# Patient Record
Sex: Male | Born: 1970 | Race: White | Hispanic: No | Marital: Married | Smoking: Current every day smoker
Health system: Southern US, Community
[De-identification: ages and names within clinical notes are randomized; demographics above are authoritative.]

## PROBLEM LIST (undated history)

## (undated) HISTORY — PX: KNEE SURGERY: SHX244

---

## 1997-06-11 ENCOUNTER — Emergency Department (HOSPITAL_COMMUNITY): Admission: EM | Admit: 1997-06-11 | Discharge: 1997-06-11 | Payer: Self-pay | Admitting: Emergency Medicine

## 2001-09-10 ENCOUNTER — Emergency Department (HOSPITAL_COMMUNITY): Admission: EM | Admit: 2001-09-10 | Discharge: 2001-09-10 | Payer: Self-pay | Admitting: Emergency Medicine

## 2005-02-23 ENCOUNTER — Emergency Department (HOSPITAL_COMMUNITY): Admission: EM | Admit: 2005-02-23 | Discharge: 2005-02-23 | Payer: Self-pay | Admitting: Emergency Medicine

## 2006-12-04 ENCOUNTER — Emergency Department (HOSPITAL_COMMUNITY): Admission: EM | Admit: 2006-12-04 | Discharge: 2006-12-04 | Payer: Self-pay | Admitting: Emergency Medicine

## 2008-06-04 IMAGING — CT CT HEAD W/O CM
1 series · 16 of 30 positions shown, 20 images · IV contrast (agent unspecified)
Comparison: None.

CLINICAL DATA: Left eye blurriness, dizziness.
 HEAD CT WITHOUT CONTRAST:
TECHNIQUE: Contiguous axial images were obtained from the base of the skull through the vertex according to standard protocol without contrast.

[Series 2: head_seq 4.5 h37s st · axial · 0.43mm/px · z∈[-153,-9]mm · 16 of 36 slices shown, 20 images]
[im 2/36  brain]
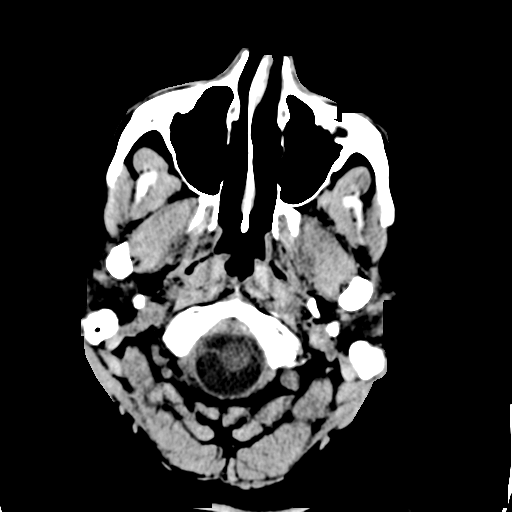
[im 2/36  bone]
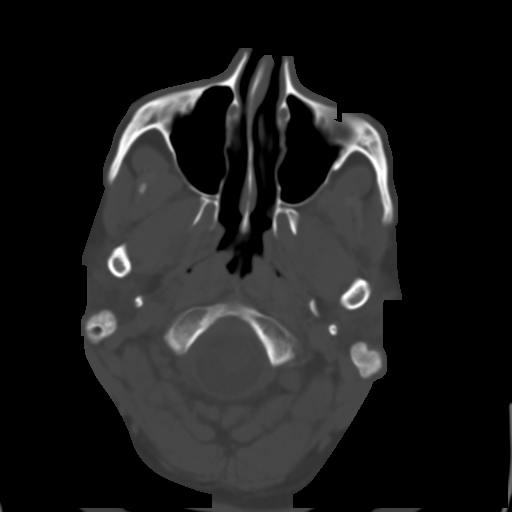
[im 4/36  brain]
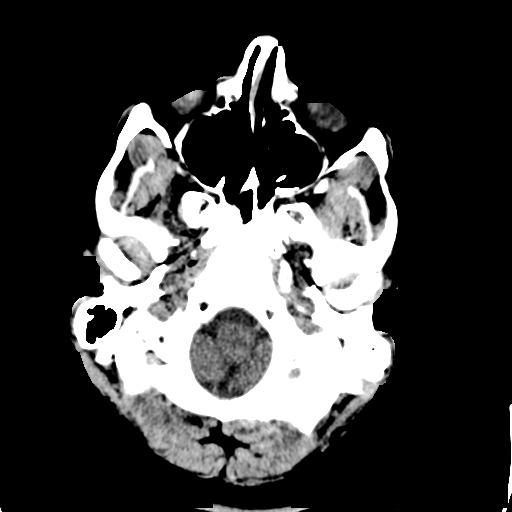
[im 7/36  brain]
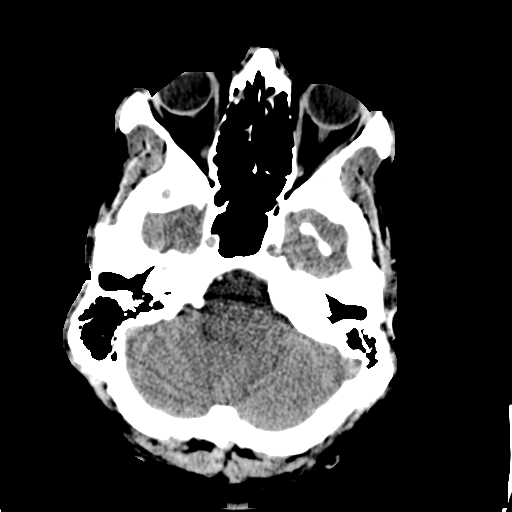
[im 9/36  brain]
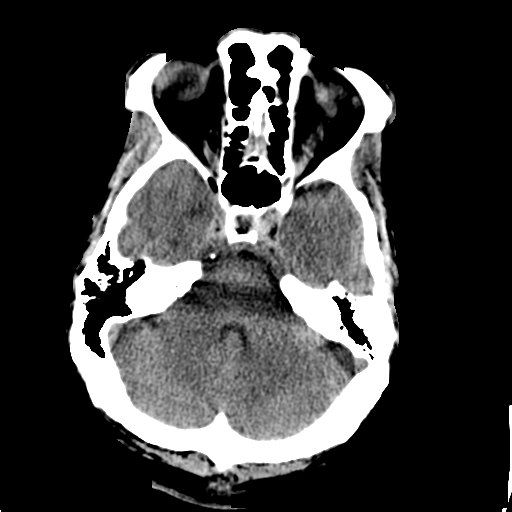
[im 10/36  brain]
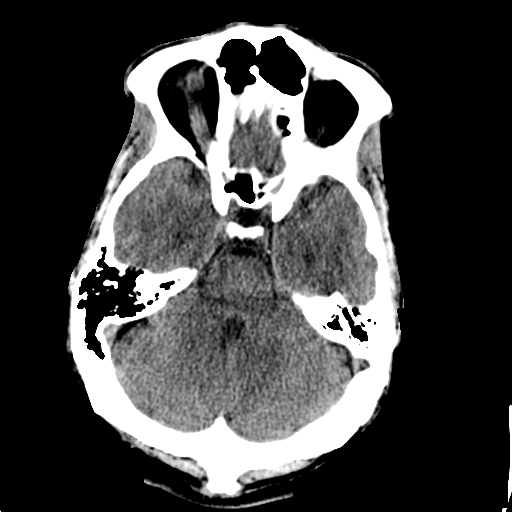
[im 10/36  bone]
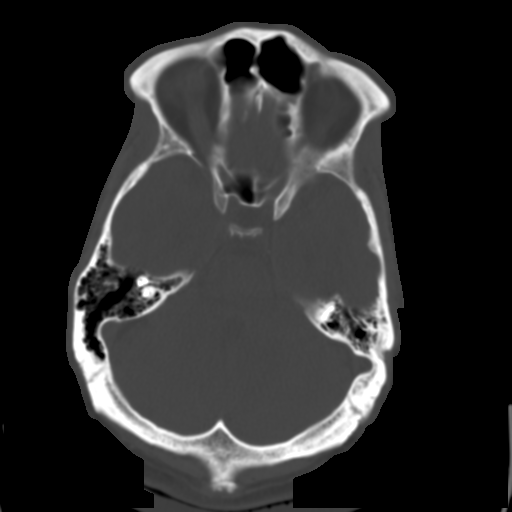
[im 13/36  brain]
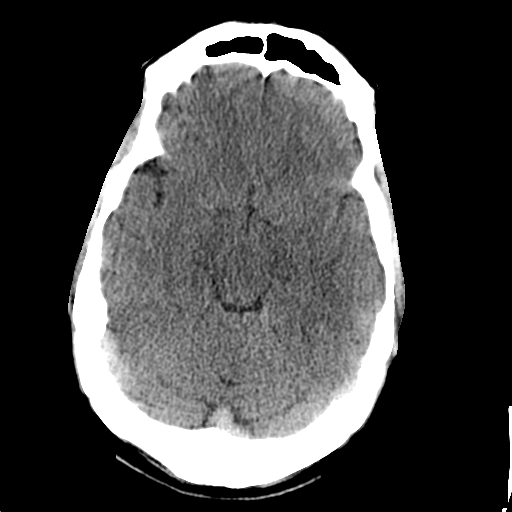
[im 15/36  brain]
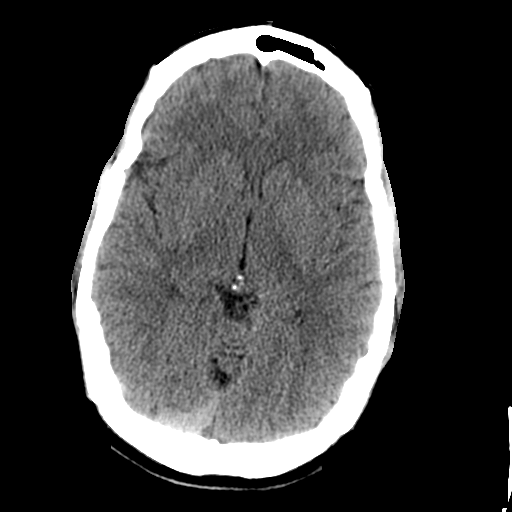
[im 17/36  brain]
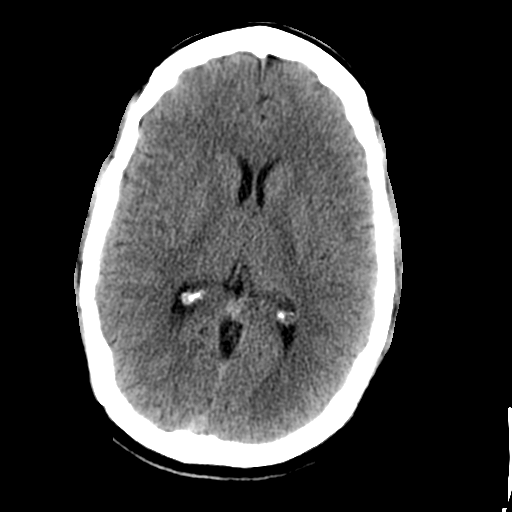
[im 19/36  brain]
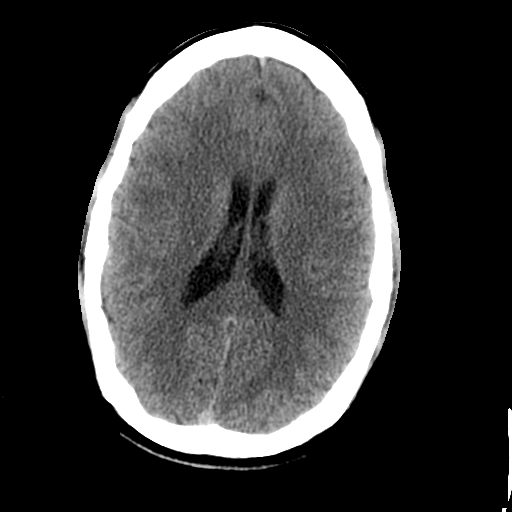
[im 19/36  bone]
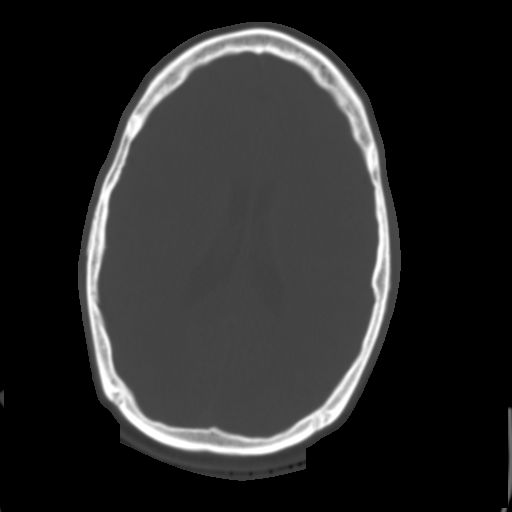
[im 21/36  brain]
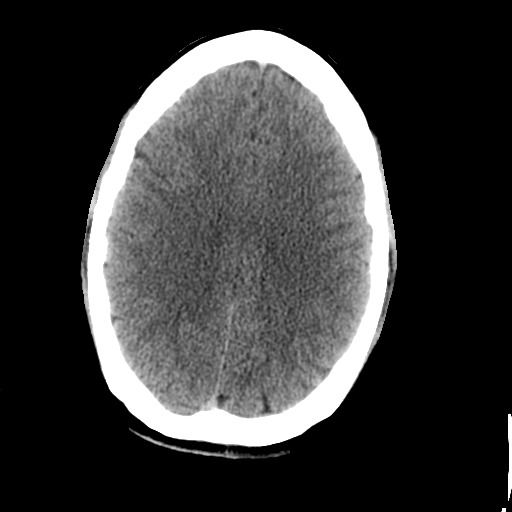
[im 23/36  brain]
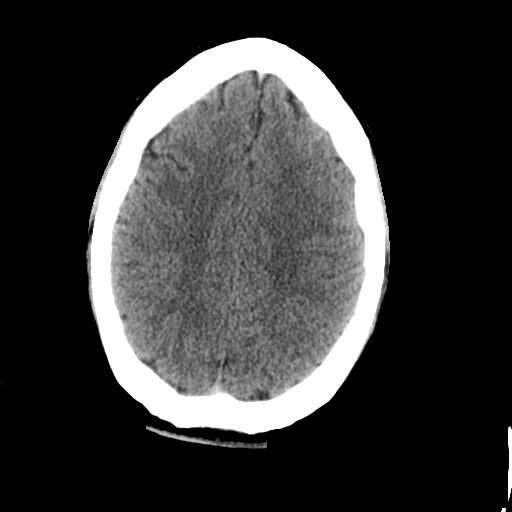
[im 26/36  brain]
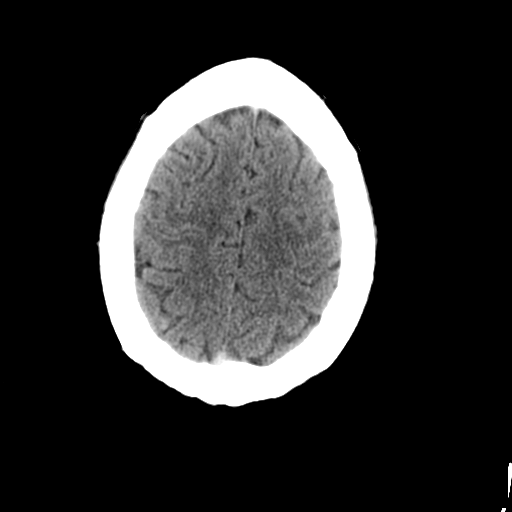
[im 27/36  brain]
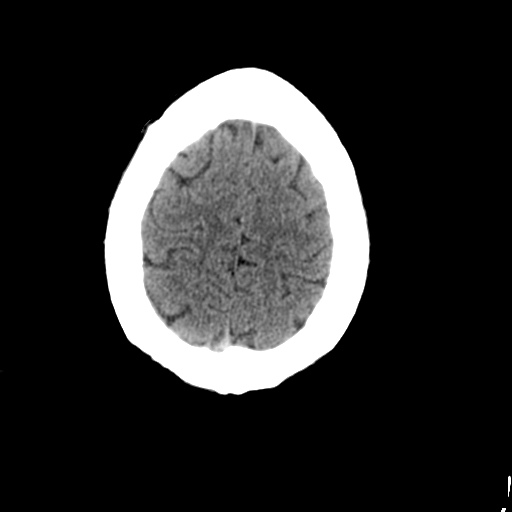
[im 27/36  bone]
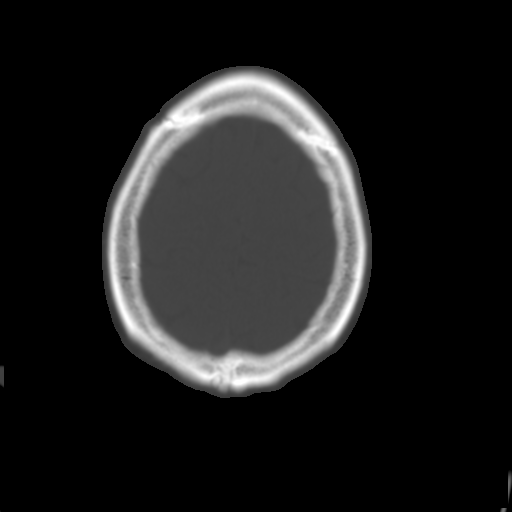
[im 29/36  brain]
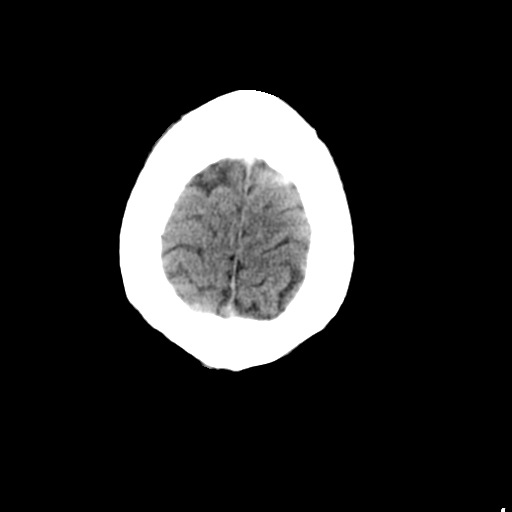
[im 32/36  brain]
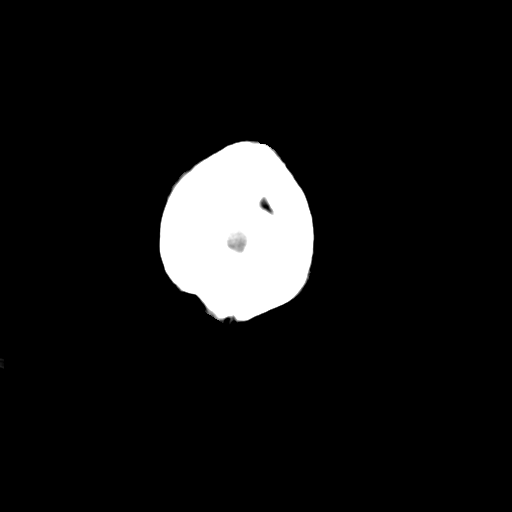
[im 34/36  brain]
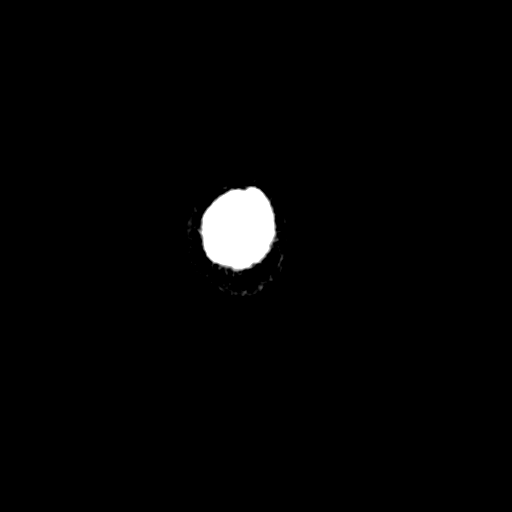

[16 of 30 positions shown; findings below may reference images not displayed]

FINDINGS: There is no evidence of intracranial hemorrhage, brain edema, acute infarct, mass lesion, or mass effect.  No other intraaxial abnormalities are seen, and the ventricles are within normal limits.  No abnormal extraaxial fluid collections or masses are identified.  No skull abnormalities are noted.
IMPRESSION: Negative non-contrast head CT.

## 2010-12-02 LAB — DIFFERENTIAL
Basophils Relative: 0
Eosinophils Absolute: 0.1
Eosinophils Relative: 2
Lymphocytes Relative: 29
Lymphs Abs: 1.8
Neutro Abs: 4

## 2010-12-02 LAB — CBC
Hemoglobin: 15.5
MCHC: 36.1 — ABNORMAL HIGH
MCV: 92.5
RBC: 4.65

## 2010-12-02 LAB — BASIC METABOLIC PANEL
CO2: 31
GFR calc non Af Amer: >60

## 2010-12-02 LAB — PROTIME-INR: INR: 1

## 2010-12-02 LAB — APTT: aPTT: 32

## 2020-08-14 DIAGNOSIS — M25521 Pain in right elbow: Secondary | ICD-10-CM | POA: Diagnosis not present

## 2020-08-14 DIAGNOSIS — M7021 Olecranon bursitis, right elbow: Secondary | ICD-10-CM | POA: Diagnosis not present

## 2021-01-18 DIAGNOSIS — I10 Essential (primary) hypertension: Secondary | ICD-10-CM | POA: Diagnosis not present

## 2021-01-18 DIAGNOSIS — J441 Chronic obstructive pulmonary disease with (acute) exacerbation: Secondary | ICD-10-CM | POA: Diagnosis not present

## 2021-01-18 DIAGNOSIS — L03012 Cellulitis of left finger: Secondary | ICD-10-CM | POA: Diagnosis not present

## 2021-01-18 DIAGNOSIS — J449 Chronic obstructive pulmonary disease, unspecified: Secondary | ICD-10-CM | POA: Diagnosis not present

## 2021-01-18 DIAGNOSIS — F17200 Nicotine dependence, unspecified, uncomplicated: Secondary | ICD-10-CM | POA: Diagnosis not present

## 2021-06-08 DIAGNOSIS — Z1211 Encounter for screening for malignant neoplasm of colon: Secondary | ICD-10-CM | POA: Diagnosis not present

## 2021-06-08 DIAGNOSIS — Z0000 Encounter for general adult medical examination without abnormal findings: Secondary | ICD-10-CM | POA: Diagnosis not present

## 2021-06-08 DIAGNOSIS — Z0100 Encounter for examination of eyes and vision without abnormal findings: Secondary | ICD-10-CM | POA: Diagnosis not present

## 2022-02-06 DIAGNOSIS — I10 Essential (primary) hypertension: Secondary | ICD-10-CM | POA: Diagnosis not present

## 2022-02-19 DIAGNOSIS — J0100 Acute maxillary sinusitis, unspecified: Secondary | ICD-10-CM | POA: Diagnosis not present

## 2022-03-14 ENCOUNTER — Ambulatory Visit (HOSPITAL_BASED_OUTPATIENT_CLINIC_OR_DEPARTMENT_OTHER): Payer: Self-pay | Admitting: Family Medicine

## 2022-03-22 DIAGNOSIS — I10 Essential (primary) hypertension: Secondary | ICD-10-CM | POA: Diagnosis not present

## 2022-04-27 DIAGNOSIS — I10 Essential (primary) hypertension: Secondary | ICD-10-CM | POA: Diagnosis not present

## 2022-05-01 NOTE — Progress Notes (Signed)
CC: Patient here to establish care and wishes to discuss the following:   COPD: Patient reports never being formally diagnosed with COPD but has "always been treated" as if he has it. Reports having COVID-19 about 3 weeks ago and has been suffering from a "COVID hangover" the past couple of weeks. He reports feeling more fatigued and "running out of energy." He reports that shortness of breath is common for him, but feels more short of breath than his normal. Reports using albuterol 3x daily. He was prescribed Advair but has been out of the medication. He has never seen a pulmonologist and does not want a referral today.   Alcohol Abuse: cold [place] quit on Friday night/Saturday morning. His last drink was on Saturday 04/30/22 at [place]. He reports drinking about 8 shots per day for the past 7-8 years and "probably more" on the weekend. Pt denies chest pain, tremors, auditory or visual hallucinations, history of withdrawal symptoms, and being hospitalized for seizures r/t withdrawal. He states he feel as if he is experiencing "a permanent hangover."   Hypertension: Reports seeing his last PCP in 2023 and has been seeing a [facility] provider to get his anti-HTN meds. Reports missing about 5-6 days of his daily medication. Does not check his BP at home. Denies chest pain, an increase in shortness of breath, vision changes, headaches, lightheadedness, double or blurred vision. He reports some minor lower extremity edema when he wears "church socks." Reports that every time he goes to the doctor, his BP is "borderline."    Last PCP visit was possibly in May/June 2023  [facility] provider, [facility] (Dr. Ardelle Balls)- plan to obtain records  Provider relocated, now establishing care here.   Testosterone Replacement: He would like to restart testosterone replacement therapy, as he been feeling  "awful." He was taking testosterone every 2 weeks for about one year (managed by PCP, Dr. Crist Fat) but did not feel comfortable having him manage this. At one point, saw [facility] urology. He would like to be referred to a urologist for this medication management.   Review of Systems  Constitutional:  Negative for chills and fever.  Eyes:  Negative for blurred vision and double vision.  Respiratory:  Positive for cough, shortness of breath ("baseline" for him) and wheezing.   Cardiovascular:  Positive for leg swelling. Negative for chest pain and palpitations.  Gastrointestinal:  Positive for nausea. Negative for vomiting.  Musculoskeletal:  Negative for myalgias.  Neurological:  Positive for headaches. Negative for dizziness, tingling, tremors, sensory change and speech change.  Psychiatric/Behavioral:  Negative for depression, hallucinations and suicidal ideas. The patient is nervous/anxious.      Brief History, Exam, Impression, and Recommendations:    BP (!) 150/100 Comment: Repeat BP  Pulse 90   Ht '6\' 4"'$  (1.93 m)   Wt 228 lb 3.2 oz (103.5 kg)   SpO2 98%   BMI 27.78 kg/m   Physical Exam Constitutional:      Appearance: Normal appearance.  Cardiovascular:     Rate and Rhythm: Normal rate and regular rhythm.  Pulmonary:     Breath sounds: Wheezing (expiratory) present.  Neurological:     Mental Status: He is alert.  Psychiatric:        Mood and Affect: Mood normal.        Behavior: Behavior normal.        Thought Content: Thought content normal.        Judgment: Judgment normal.     Problem  List Items Addressed This Visit       Cardiovascular and Mediastinum   HTN (hypertension) - Primary    Patient's main concern today is getting his amlodipine-valsartan 10-'160mg'$  medication refill. He is currently seeing a [facility] to obtain his refills since seeing his last PCP. Reports that he has missed a few days taking this daily medication and has not checked it at home. Advised pt to  get home BP monitor to assess BP, where to obtain accurate cuff ([email or web address]), and help with medication management. Educated pt on how to take accurate blood pressure and taking recordings at home. Advised pt to immediately seek emergency care if he develops an acute headache, chest pain, shortness of breath, double/blurred vision. He verbalized understanding.       Relevant Medications   amLODipine-valsartan (EXFORGE) 10-160 MG tablet     Respiratory   Chronic obstructive pulmonary disease (COPD) ([place])    Reports having to take his albuterol rescuer inhaler TID, since he does not have any refills of Advair. Reports an increase in his "normal" shortness of breath, especially with exertion. Exam with slight expiratory wheezing in lower lobes. Will refill Advair today. Does not want referral to pulmonology at this time.      Relevant Medications   fluticasone-salmeterol (ADVAIR DISKUS) 100-50 MCG/ACT AEPB     Other   Alcohol abuse    Patient reports having an "epiphany" and deciding to quit drinking "cold [place]" on early Saturday morning. Patient reports feeling like he has "a permanent hangover" but denies headache, diaphoresis, auditory and visual hallucinations, tremors, palpitations, tachycardia, change in vision, chest pain and shortness of breath. Reports occasional nausea and insomnia. Performed CIWA for alcohol scale in adults-on Up[employer]Date- with a score of 4 (very mild withdrawal). Educated pt about withdrawal symptoms often occurring up to 96 hours from last drink. Advised pt if he experiences any of the above stated symptoms to seek emergency care.       RESOLVED: Tinnitus    Patient reports left-sided tinnitus and would like referral to ENT for hearing evaluation.       Other Visit Diagnoses     Long-term current use of testosterone replacement therapy       Relevant Orders   Ambulatory referral to Urology       Return in 2 weeks (on 05/16/2022) for BP check/HTN  mgmt.  __________________________________________ Les Pou, FNP-C Primary [facility] and [place]

## 2022-05-02 ENCOUNTER — Ambulatory Visit (INDEPENDENT_AMBULATORY_CARE_PROVIDER_SITE_OTHER): Payer: BC Managed Care – PPO | Admitting: Family Medicine

## 2022-05-02 ENCOUNTER — Other Ambulatory Visit (HOSPITAL_BASED_OUTPATIENT_CLINIC_OR_DEPARTMENT_OTHER): Payer: Self-pay

## 2022-05-02 ENCOUNTER — Encounter (HOSPITAL_BASED_OUTPATIENT_CLINIC_OR_DEPARTMENT_OTHER): Payer: Self-pay | Admitting: Family Medicine

## 2022-05-02 VITALS — BP 150/100 | HR 90 | Ht 76.0 in | Wt 228.2 lb

## 2022-05-02 DIAGNOSIS — J449 Chronic obstructive pulmonary disease, unspecified: Secondary | ICD-10-CM | POA: Diagnosis not present

## 2022-05-02 DIAGNOSIS — I10 Essential (primary) hypertension: Secondary | ICD-10-CM

## 2022-05-02 DIAGNOSIS — H9312 Tinnitus, left ear: Secondary | ICD-10-CM

## 2022-05-02 DIAGNOSIS — F1010 Alcohol abuse, uncomplicated: Secondary | ICD-10-CM | POA: Diagnosis not present

## 2022-05-02 DIAGNOSIS — Z79890 Hormone replacement therapy (postmenopausal): Secondary | ICD-10-CM

## 2022-05-02 DIAGNOSIS — H9319 Tinnitus, unspecified ear: Secondary | ICD-10-CM | POA: Insufficient documentation

## 2022-05-02 HISTORY — DX: Alcohol abuse, uncomplicated: F10.10

## 2022-05-02 HISTORY — DX: Essential (primary) hypertension: I10

## 2022-05-02 HISTORY — DX: Tinnitus, unspecified ear: H93.19

## 2022-05-02 HISTORY — DX: Chronic obstructive pulmonary disease, unspecified: J44.9

## 2022-05-02 MED ORDER — FLUTICASONE-SALMETEROL 100-50 MCG/ACT IN AEPB
1.0000 | INHALATION_SPRAY | Freq: Two times a day (BID) | RESPIRATORY_TRACT | 1 refills | Status: DC
  Filled 2022-05-02 – 2022-05-12 (×2): qty 60, 30d supply, fill #0

## 2022-05-02 MED ORDER — AMLODIPINE BESYLATE-VALSARTAN 10-160 MG PO TABS
1.0000 | ORAL_TABLET | Freq: Every day | ORAL | 0 refills | Status: DC
  Filled 2022-05-02 – 2022-05-12 (×2): qty 30, 30d supply, fill #0

## 2022-05-02 NOTE — Assessment & Plan Note (Signed)
Patient reports left-sided tinnitus and would like referral to ENT for hearing evaluation.

## 2022-05-02 NOTE — Assessment & Plan Note (Addendum)
Patient's main concern today is getting his amlodipine-valsartan 10-'160mg'$  medication refill. He is currently seeing a [facility] to obtain his refills since seeing his last PCP. Reports that he has missed a few days taking this daily medication and has not checked it at home. Advised pt to get home BP monitor to assess BP, where to obtain accurate cuff ([email or web address]), and help with medication management. Educated pt on how to take accurate blood pressure and taking recordings at home. Advised pt to immediately seek emergency care if he develops an acute headache, chest pain, shortness of breath, double/blurred vision. He verbalized understanding.

## 2022-05-02 NOTE — Assessment & Plan Note (Signed)
Reports having to take his albuterol rescuer inhaler TID, since he does not have any refills of Advair. Reports an increase in his "normal" shortness of breath, especially with exertion. Exam with slight expiratory wheezing in lower lobes. Will refill Advair today. Does not want referral to pulmonology at this time.

## 2022-05-02 NOTE — Assessment & Plan Note (Signed)
Patient reports having an "epiphany" and deciding to quit drinking "cold [place]" on early Saturday morning. Patient reports feeling like he has "a permanent hangover" but denies headache, diaphoresis, auditory and visual hallucinations, tremors, palpitations, tachycardia, change in vision, chest pain and shortness of breath. Reports occasional nausea and insomnia. Performed CIWA for alcohol scale in adults-on UpToDate- with a score of 4 (very mild withdrawal). Educated pt about withdrawal symptoms often occurring up to 96 hours from last drink. Advised pt if he experiences any of the above stated symptoms to seek emergency care.

## 2022-05-05 NOTE — Addendum Note (Signed)
Addended by: Les Pou on: 05/05/2022 01:36 PM   Modules accepted: Level of Service

## 2022-05-09 ENCOUNTER — Other Ambulatory Visit (HOSPITAL_BASED_OUTPATIENT_CLINIC_OR_DEPARTMENT_OTHER): Payer: Self-pay

## 2022-05-09 ENCOUNTER — Telehealth (HOSPITAL_BASED_OUTPATIENT_CLINIC_OR_DEPARTMENT_OTHER): Payer: Self-pay | Admitting: Family Medicine

## 2022-05-09 NOTE — Telephone Encounter (Signed)
Attempted to reach out to patient today around 11:30AM. Unable to leave voicemail due to voicemail not being set up.

## 2022-05-12 ENCOUNTER — Other Ambulatory Visit: Payer: Self-pay

## 2022-05-13 ENCOUNTER — Other Ambulatory Visit (HOSPITAL_BASED_OUTPATIENT_CLINIC_OR_DEPARTMENT_OTHER): Payer: Self-pay

## 2022-05-17 ENCOUNTER — Ambulatory Visit (INDEPENDENT_AMBULATORY_CARE_PROVIDER_SITE_OTHER): Payer: BC Managed Care – PPO | Admitting: Family Medicine

## 2022-05-17 ENCOUNTER — Other Ambulatory Visit (HOSPITAL_BASED_OUTPATIENT_CLINIC_OR_DEPARTMENT_OTHER): Payer: Self-pay | Admitting: Family Medicine

## 2022-05-17 ENCOUNTER — Encounter (HOSPITAL_BASED_OUTPATIENT_CLINIC_OR_DEPARTMENT_OTHER): Payer: Self-pay | Admitting: Family Medicine

## 2022-05-17 VITALS — BP 157/117 | HR 86 | Temp 98.3°F | Ht 76.0 in | Wt 230.5 lb

## 2022-05-17 DIAGNOSIS — F1090 Alcohol use, unspecified, uncomplicated: Secondary | ICD-10-CM | POA: Diagnosis not present

## 2022-05-17 DIAGNOSIS — I10 Essential (primary) hypertension: Secondary | ICD-10-CM

## 2022-05-17 LAB — COMPREHENSIVE METABOLIC PANEL
ALT: 23 IU/L (ref 0–44)
AST: 28 IU/L (ref 0–40)
Albumin/Globulin Ratio: 1.9 (ref 1.2–2.2)
Albumin: 4.5 g/dL (ref 3.8–4.9)
Alkaline Phosphatase: 71 IU/L (ref 44–121)
BUN/Creatinine Ratio: 9 (ref 9–20)
BUN: 8 mg/dL (ref 6–24)
Bilirubin Total: 0.2 mg/dL (ref 0.0–1.2)
CO2: 23 mmol/L (ref 20–29)
Calcium: 9 mg/dL (ref 8.7–10.2)
Chloride: 101 mmol/L (ref 96–106)
Creatinine, Ser: 0.91 mg/dL (ref 0.76–1.27)
Globulin, Total: 2.4 g/dL (ref 1.5–4.5)
Glucose: 78 mg/dL (ref 70–99)
Potassium: 4.4 mmol/L (ref 3.5–5.2)
Sodium: 142 mmol/L (ref 134–144)
Total Protein: 6.9 g/dL (ref 6.0–8.5)
eGFR: 101 mL/min/{1.73_m2} (ref >59)

## 2022-05-17 MED ORDER — AMLODIPINE BESYLATE-VALSARTAN 10-320 MG PO TABS: 1.0000 | ORAL_TABLET | Freq: Every day | ORAL | 0 refills | Status: DC

## 2022-05-17 MED ORDER — NALTREXONE HCL 50 MG PO TABS: 25.0000 mg | ORAL_TABLET | Freq: Every day | ORAL | 0 refills | Status: DC

## 2022-05-17 NOTE — Progress Notes (Signed)
Established Patient Office Visit  Subjective   Patient ID: Adam Esparza, male    DOB: 30-Sep-1970  Age: 52 y.o. MRN: [id number]  Adam Esparza is a 52 yo male patient who is following-up today for HTN and alcohol use disorder.   HTN: Patient did not bring his home blood pressure readings log.  He reports he has not taken his medication in the past 4 days.  He reports that his blood pressure is usually in the 150s/90s.  He denies chest pain, changes in vision, shortness of breath, lower extremity edema, weakness, headache.  He denies any side effects from his medication.  Reports having issues with sleeping at night-he feels as if it is a nap-and issues with night sweats.  Alcohol Use: Currently having 1-2 small drinks per day vs. 8 drinks per day. He reports that after his last appt, he did not have a drink for 3-4 days with no withdrawal symptoms, including anxiety, restlessness, N/V, tremors, HA, inc HR/palpitations, mood swings , visual/auditory/tactile hallucinations, confusion or seizures. He reports that he "just wanted to have a drink." His job is very stressful.   Reports that he accidentally smashed his phone and got a new number. Updated in the chart.   Review of Systems  Constitutional:  Negative for malaise/fatigue.  HENT:  Negative for ear pain and tinnitus.   Eyes:  Negative for blurred vision and double vision.  Respiratory:  Negative for cough and shortness of breath.   Cardiovascular:  Negative for chest pain, palpitations and leg swelling.  Gastrointestinal:  Negative for abdominal pain, nausea and vomiting.  Musculoskeletal:  Negative for myalgias.  Neurological:  Negative for dizziness, speech change, seizures, weakness and headaches.  Psychiatric/Behavioral:  Negative for depression and suicidal ideas. The patient is not nervous/anxious.     Objective:    BP (!) 157/117   Pulse 86   Temp 98.3 F (36.8 C) (Oral)   Ht 6\' 4"  (1.93 m)   Wt 230 lb 8 oz (104.6 kg)    SpO2 99%   BMI 28.06 kg/m  BP Readings from Last 3 Encounters:  05/17/22 (!) 157/117  05/02/22 (!) 150/100     Physical Exam Constitutional:      Appearance: Normal appearance.  Cardiovascular:     Rate and Rhythm: Normal rate and regular rhythm.     Heart sounds: Normal heart sounds.  Pulmonary:     Effort: Prolonged expiration present. No respiratory distress.     Breath sounds: Decreased air movement present. Examination of the right-upper field reveals decreased breath sounds. Examination of the left-upper field reveals decreased breath sounds. Decreased breath sounds present.  Neurological:     Mental Status: He is alert.      Assessment & Plan:  1. Primary hypertension Patient has a history of hypertension.  He is currently taking amlodipine-valsartan 10-160mg  daily. He reports he has not taken this medication in the past 4 days. Denies headache, lower extremity edema, lightheadedness, nausea, shortness of breath, palpitations, epistaxis, anxiety, oliguria/anuria, and chest pain/tightness/pressure.  Patient sits comfortably in chair and answers questions appropriately.  Physical exam is unremarkable.  No unilateral weakness, issues with gait or ambulation, or slurred speech.  No neurological or ophthalmologic deficits. Cardiac exam negative for murmur, S3 heart sound with regular rate and rhythm. No hypoxia, tachypnea, or respiratory distress. Decreased lung sounds heard in the upper right and left lobes with prolonged expiration. States he has not picked up his Advair yet either. Educated patient about  the importance of compliance with daily medication. Advised him to seek emergency care if he experiences any acute symptoms (not exclusive to those listed above). Despite patient not taking recent medication, based on his home BP readings, I want to increase the dose today to amlodipine-valsartan 10-320mg  daily. Patient agreeable to increase in dose. Will obtain electrolytes and kidney  function today (CMP). Advised of possible side effects and to notify the office if he would like to return sooner due to adverse effects. Return in 1-2 weeks for HTN management/BP follow-up.   2. Alcohol use disorder Patient has a history of alcohol use disorder and would like to start a medication to help supress cravings. He does not have a history of opioid use. Educated patient about benefits of AA and CBT.  He reports trying AA once in the past, going to a few times, but it is not "for him."  Declines CBT at this time.  He would like to try medication at this time. Prescribed naltrexone 25mg  daily to assist with suppression of cravings. At next appt, will plan to increase to 50mg  daily, as long as he is tolerating it. Advised side effects could include N/V, decreased appetite, dizziness, and anxiety. Will obtain liver enzymes today for baseline.  - COMPLETE METABOLIC PANEL WITH GFR    Spent 30 minutes on this patient encounter, including preparation, chart review, face-to-face counseling with patient and coordination of care, and documentation of encounter.    Return in about 1 week (around 05/24/2022) for HTN follow-up.    Les Pou, FNP

## 2022-05-19 ENCOUNTER — Encounter (HOSPITAL_BASED_OUTPATIENT_CLINIC_OR_DEPARTMENT_OTHER): Payer: Self-pay | Admitting: Family Medicine

## 2022-05-19 ENCOUNTER — Other Ambulatory Visit (HOSPITAL_BASED_OUTPATIENT_CLINIC_OR_DEPARTMENT_OTHER): Payer: Self-pay

## 2022-05-19 ENCOUNTER — Telehealth (HOSPITAL_BASED_OUTPATIENT_CLINIC_OR_DEPARTMENT_OTHER): Payer: Self-pay

## 2022-05-19 ENCOUNTER — Other Ambulatory Visit (HOSPITAL_BASED_OUTPATIENT_CLINIC_OR_DEPARTMENT_OTHER): Payer: Self-pay | Admitting: Family Medicine

## 2022-05-19 ENCOUNTER — Ambulatory Visit (INDEPENDENT_AMBULATORY_CARE_PROVIDER_SITE_OTHER): Payer: BC Managed Care – PPO | Admitting: Family Medicine

## 2022-05-19 DIAGNOSIS — I10 Essential (primary) hypertension: Secondary | ICD-10-CM

## 2022-05-19 MED ORDER — CHLORTHALIDONE 15 MG PO TABS: 15.0000 mg | ORAL_TABLET | Freq: Every day | ORAL | 0 refills | Status: DC

## 2022-05-19 MED ORDER — NALTREXONE HCL 50 MG PO TABS
25.0000 mg | ORAL_TABLET | Freq: Every day | ORAL | 0 refills | Status: DC
  Filled 2022-05-19: qty 45, 90d supply, fill #0

## 2022-05-19 MED ORDER — HYDROCHLOROTHIAZIDE 12.5 MG PO TABS
12.5000 mg | ORAL_TABLET | Freq: Every day | ORAL | 1 refills | Status: DC
  Filled 2022-05-19: qty 90, 90d supply, fill #0

## 2022-05-19 MED ORDER — AMLODIPINE BESYLATE-VALSARTAN 10-320 MG PO TABS
1.0000 | ORAL_TABLET | Freq: Every day | ORAL | 0 refills | Status: DC
  Filled 2022-05-19: qty 90, 90d supply, fill #0

## 2022-05-19 MED ORDER — CLONIDINE HCL 0.1 MG PO TABS: 0.1000 mg | ORAL_TABLET | Freq: Once | ORAL | Status: DC

## 2022-05-19 NOTE — Telephone Encounter (Signed)
Error; PCP double checking for previous note.

## 2022-05-19 NOTE — Progress Notes (Unsigned)
Patient came to office around 12:15pm to inform us that his prescriptions were almost $300 at the [facility] and he was very concerned about his blood pressure this morning. He was unsure what to do about this predicament. He called earlier today (around 11:30AM) stating his home blood pressure reading was 170/117 after taking his amlodipine-valsartan 10-160mg  (which is his previous dose). He called the office back but was unable to reach [place], therefore, he came to the office. No acute symptoms at this time. Denies chest pain, heart palpitations, shortness of breath, change in vision, acute headache, slurred speech, facial drooping, and weakness. Advised patient to sit and relax in lobby chair. 12:30PM his BP was 164/114. Administered 0.1mg  clonidine PO around 12:33PM. Rechecked BP at 12:45PM- 151/106. Instructed him to pick-up all medications at the pharmacy. Added HCTZ 12.5mg  to current medication regimen for BP control. Recent CMP within normal range. Advised him to take this prescription as soon as he picked it up. Instructed patient to take amlodipine-valsartan 10-320mg  daily starting tomorrow morning. Advised patient to recheck blood pressure later this evening after taking HCTZ. Advised him to seek emergency care if he experiences chest pain, racing heart, shortness of breath, change in vision, acute headache, slurred speech, facial drooping, and weakness. Patient verbalizes understanding.

## 2022-05-19 NOTE — Progress Notes (Signed)
Patient came to office around 12:15pm to inform us that his prescriptions were almost $300 at the [facility] and he was very concerned about his blood pressure this morning. He was unsure what to do about this predicament. He called earlier today (around 11:30AM) stating his home blood pressure reading was 170/117 after taking his amlodipine-valsartan 10-160mg  (which is his previous dose). He called the office back but was unable to reach [place], therefore, he came to the office. No acute symptoms at this time. Denies chest pain, heart palpitations, shortness of breath, change in vision, acute headache, slurred speech, facial drooping, and weakness. Advised patient to sit and relax in lobby chair. 12:30PM his BP was 164/114. Administered 0.1mg  clonidine PO around 12:33PM. Rechecked BP at 12:45PM- 151/106. Instructed him to pick-up all medications at the pharmacy. Added HCTZ 12.5mg  to current medication regimen for BP control. Recent CMP within normal range. Advised him to take this prescription this afternoon. Instructed patient to take amlodipine-valsartan 10-320mg  daily starting tomorrow morning. Advised patient to recheck blood pressure later this evening after taking HCTZ. Advised him to seek emergency care if he experiences chest pain, racing heart, shortness of breath, change in vision, acute headache, slurred speech, facial drooping, and weakness. Patient verbalizes understanding.   I spent 20 minutes on this patient encounter, including face-to-face counseling with patient and coordination of care and documentation of encounter.    Les Pou, FNP-C

## 2022-05-19 NOTE — Telephone Encounter (Signed)
Patient called in giving [place] a blood pressure reading but has not started his new medications. 170/117 is the reading he gave, spoke with PCP. She instructed patient to pick up new medication and she will send in an additional blood pressure medicine, Chlorthalidone for patient to start taking also. Patient expressed understanding, instructed him to call us on Tuesday with more readings after starting new medications.

## 2022-05-31 ENCOUNTER — Encounter (HOSPITAL_BASED_OUTPATIENT_CLINIC_OR_DEPARTMENT_OTHER): Payer: Self-pay | Admitting: Family Medicine

## 2022-05-31 ENCOUNTER — Ambulatory Visit (INDEPENDENT_AMBULATORY_CARE_PROVIDER_SITE_OTHER): Payer: BC Managed Care – PPO | Admitting: Family Medicine

## 2022-05-31 VITALS — BP 133/109 | HR 85 | Ht 76.0 in | Wt 228.0 lb

## 2022-05-31 DIAGNOSIS — F1090 Alcohol use, unspecified, uncomplicated: Secondary | ICD-10-CM

## 2022-05-31 DIAGNOSIS — I10 Essential (primary) hypertension: Secondary | ICD-10-CM | POA: Diagnosis not present

## 2022-05-31 DIAGNOSIS — K219 Gastro-esophageal reflux disease without esophagitis: Secondary | ICD-10-CM | POA: Diagnosis not present

## 2022-05-31 MED ORDER — HYDROCHLOROTHIAZIDE 25 MG PO TABS: 25.0000 mg | ORAL_TABLET | Freq: Every day | ORAL | 0 refills | Status: DC

## 2022-05-31 MED ORDER — FAMOTIDINE 40 MG PO TABS: 40.0000 mg | ORAL_TABLET | Freq: Two times a day (BID) | ORAL | 0 refills | Status: DC

## 2022-05-31 NOTE — Progress Notes (Signed)
Established Patient Office Visit  Subjective   Patient ID: RIVER SHOULTS, male    DOB: 1970-10-19  Age: 52 y.o. MRN: [id number]  Adam Esparza is 52 yo male patient who presents today for HTN follow-up.   HTN: Taking amlodipine-valsartan 10-320mg  & HCTZ 12.5mg  daily in the morning. He reports having 2 large cups of coffee this morning and drinks caffeine throughout the day.  He is taking his BP daily at home, usually in the evenings after dinner. Usual readings are 130-140/90-100. His "best" reading was 127/85.   EtOH use: Naltrexone- did not pick it up d/t cost  Reports he is only drinking two beverages/night   GERD: Reports issues with burning sensation in the epigastric region. Not present frequently, only couple times a week when he eats certain foods.   Review of Systems  Constitutional:  Negative for malaise/fatigue.  Eyes:  Negative for blurred vision and double vision.  Respiratory:  Negative for cough and shortness of breath.   Cardiovascular:  Negative for chest pain and palpitations.  Neurological:  Negative for headaches.    Objective:    BP (!) 133/109   Pulse 85   Ht 6\' 4"  (1.93 m)   Wt 228 lb (103.4 kg)   SpO2 97%   BMI 27.75 kg/m  BP Readings from Last 3 Encounters:  05/31/22 (!) 133/109  05/19/22 (!) 151/106  05/17/22 (!) 157/117     Physical Exam Constitutional:      Appearance: Normal appearance.  Cardiovascular:     Rate and Rhythm: Normal rate and regular rhythm.     Pulses: Normal pulses.     Heart sounds: Normal heart sounds.  Pulmonary:     Effort: Pulmonary effort is normal. Prolonged expiration present.     Breath sounds: Decreased air movement present. Wheezing (expiratory) present.     Comments: Reports he did not take his Advair inhaler this morning  Neurological:     Mental Status: He is alert.       Assessment & Plan:  1. Primary hypertension Patient compliant with taking his amlodipine-valsartan 10-320mg  & HCTZ 12.5mg   daily in the morning. Denies to adverse side effects from medications. Denies chest pain, palpitations, changes in vision, shortness of breath, lower extremity edema,  lightheadedness/dizziness, weakness, or cough.  Reports occasional headaches, but not persisting headaches that cause vision changes, difficulty with speech, or facial weakness. BP recheck still elevated. Discussed options, patient agreeable to increasing HCTZ to 25mg . Sent 25mg  HCTZ to pharmacy. Reviewed recent CMP done on 05/17/2022. Advised patient to continue monitor BP at home. Plan to follow-up in 4 weeks for hypertension.   2. Gastroesophageal reflux disease without esophagitis Patient reports feeling burning sensation that travels up his throat after eating certain foods. Denies chest pain/pressure, pain in his arm or neck, N/V, diaphoresis, or shortness of breath. Most likely acid reflux. Patient reports relief with Alka-Selzter; however, due to sodium content, advised him to stop taking this medication. Discussed trialing H2 blocker- will send to pharmacy to see if insurance will cover. Advised patient to let me know if he does not experience symptom relief with Pepcid.   3. Alcohol use disorder Patient is not currently taking naltrexone for alcohol cravings. Reports that he has had support from his wife and is currently only drinking two beverages per night. Congratulated him on this success and encouraged him to continue weaning off alcohol due to long-term adverse health effects.    Return in about 4 weeks (around 06/28/2022) for  HTN follow-up.    Alyson Reedy, FNP

## 2022-06-02 ENCOUNTER — Encounter (HOSPITAL_COMMUNITY): Payer: Self-pay | Admitting: Emergency Medicine

## 2022-06-02 ENCOUNTER — Ambulatory Visit (HOSPITAL_COMMUNITY)
Admission: EM | Admit: 2022-06-02 | Discharge: 2022-06-02 | Disposition: A | Discharge status: Home / Self Care | Payer: BC Managed Care – PPO | Attending: Family | Admitting: Family

## 2022-06-02 ENCOUNTER — Ambulatory Visit (HOSPITAL_COMMUNITY)
Admission: EM | Admit: 2022-06-02 | Discharge: 2022-06-03 | Disposition: A | Discharge status: Home / Self Care | Payer: BC Managed Care – PPO | Attending: Family Medicine | Admitting: Family Medicine

## 2022-06-02 DIAGNOSIS — F1010 Alcohol abuse, uncomplicated: Secondary | ICD-10-CM

## 2022-06-02 DIAGNOSIS — Z566 Other physical and mental strain related to work: Secondary | ICD-10-CM | POA: Diagnosis not present

## 2022-06-02 DIAGNOSIS — J449 Chronic obstructive pulmonary disease, unspecified: Secondary | ICD-10-CM | POA: Diagnosis not present

## 2022-06-02 DIAGNOSIS — F1020 Alcohol dependence, uncomplicated: Secondary | ICD-10-CM

## 2022-06-02 DIAGNOSIS — Z79899 Other long term (current) drug therapy: Secondary | ICD-10-CM | POA: Diagnosis not present

## 2022-06-02 DIAGNOSIS — I10 Essential (primary) hypertension: Secondary | ICD-10-CM | POA: Diagnosis not present

## 2022-06-02 DIAGNOSIS — F17200 Nicotine dependence, unspecified, uncomplicated: Secondary | ICD-10-CM

## 2022-06-02 DIAGNOSIS — Z1152 Encounter for screening for COVID-19: Secondary | ICD-10-CM | POA: Insufficient documentation

## 2022-06-02 LAB — POCT URINE DRUG SCREEN - MANUAL ENTRY (I-SCREEN)
POC Amphetamine UR: NOT DETECTED
POC Buprenorphine (BUP): NOT DETECTED
POC Cocaine UR: NOT DETECTED
POC Marijuana UR: NOT DETECTED
POC Methadone UR: NOT DETECTED
POC Methamphetamine UR: NOT DETECTED
POC Morphine: NOT DETECTED
POC Oxazepam (BZO): NOT DETECTED
POC Oxycodone UR: NOT DETECTED
POC Secobarbital (BAR): NOT DETECTED

## 2022-06-02 LAB — URINALYSIS, ROUTINE W REFLEX MICROSCOPIC
Bilirubin Urine: NEGATIVE
Glucose, UA: NEGATIVE mg/dL
Hgb urine dipstick: NEGATIVE
Ketones, ur: NEGATIVE mg/dL
Leukocytes,Ua: NEGATIVE
Nitrite: NEGATIVE
Protein, ur: NEGATIVE mg/dL
Specific Gravity, Urine: 1.009 (ref 1.005–1.030)
pH: 5 (ref 5.0–8.0)

## 2022-06-02 LAB — SARS CORONAVIRUS 2 BY RT PCR: SARS Coronavirus 2 by RT PCR: NEGATIVE

## 2022-06-02 MED ORDER — THIAMINE HCL 100 MG/ML IJ SOLN: 100.0000 mg | Freq: Once | INTRAMUSCULAR | Status: DC

## 2022-06-02 MED ORDER — THIAMINE MONONITRATE 100 MG PO TABS
100.0000 mg | ORAL_TABLET | Freq: Every day | ORAL | Status: DC
  Administered 2022-06-03: 100 mg via ORAL
  Filled 2022-06-02: qty 1

## 2022-06-02 MED ORDER — LORAZEPAM 1 MG PO TABS: 1.0000 mg | ORAL_TABLET | Freq: Two times a day (BID) | ORAL | Status: DC

## 2022-06-02 MED ORDER — ADULT MULTIVITAMIN W/MINERALS CH: 1.0000 | ORAL_TABLET | Freq: Every day | ORAL | Status: DC

## 2022-06-02 MED ORDER — LORAZEPAM 1 MG PO TABS: 1.0000 mg | ORAL_TABLET | Freq: Every day | ORAL | Status: DC

## 2022-06-02 MED ORDER — ADULT MULTIVITAMIN W/MINERALS CH
1.0000 | ORAL_TABLET | Freq: Every day | ORAL | Status: DC
  Administered 2022-06-02 – 2022-06-03 (×2): 1 via ORAL
  Filled 2022-06-02 (×2): qty 1

## 2022-06-02 MED ORDER — LORAZEPAM 1 MG PO TABS: 1.0000 mg | ORAL_TABLET | Freq: Four times a day (QID) | ORAL | Status: DC | PRN

## 2022-06-02 MED ORDER — MAGNESIUM HYDROXIDE 400 MG/5ML PO SUSP: 30.0000 mL | Freq: Every day | ORAL | Status: DC | PRN

## 2022-06-02 MED ORDER — LOPERAMIDE HCL 2 MG PO CAPS: 2.0000 mg | ORAL_CAPSULE | ORAL | Status: DC | PRN

## 2022-06-02 MED ORDER — LORAZEPAM 1 MG PO TABS
1.0000 mg | ORAL_TABLET | Freq: Four times a day (QID) | ORAL | Status: DC
  Administered 2022-06-02 – 2022-06-03 (×3): 1 mg via ORAL
  Filled 2022-06-02 (×3): qty 1

## 2022-06-02 MED ORDER — LORAZEPAM 1 MG PO TABS: 1.0000 mg | ORAL_TABLET | Freq: Four times a day (QID) | ORAL | Status: DC

## 2022-06-02 MED ORDER — ALUM & MAG HYDROXIDE-SIMETH 200-200-20 MG/5ML PO SUSP: 30.0000 mL | ORAL | Status: DC | PRN

## 2022-06-02 MED ORDER — NICOTINE 21 MG/24HR TD PT24: 21.0000 mg | MEDICATED_PATCH | Freq: Once | TRANSDERMAL | Status: DC

## 2022-06-02 MED ORDER — AMLODIPINE BESYLATE 10 MG PO TABS
10.0000 mg | ORAL_TABLET | Freq: Every day | ORAL | Status: DC
  Administered 2022-06-03: 10 mg via ORAL
  Filled 2022-06-02: qty 1

## 2022-06-02 MED ORDER — LORAZEPAM 1 MG PO TABS: 1.0000 mg | ORAL_TABLET | Freq: Three times a day (TID) | ORAL | Status: DC

## 2022-06-02 MED ORDER — HYDROXYZINE HCL 25 MG PO TABS: 25.0000 mg | ORAL_TABLET | Freq: Three times a day (TID) | ORAL | Status: DC | PRN

## 2022-06-02 MED ORDER — ONDANSETRON 4 MG PO TBDP: 4.0000 mg | ORAL_TABLET | Freq: Four times a day (QID) | ORAL | Status: DC | PRN

## 2022-06-02 MED ORDER — GABAPENTIN 100 MG PO CAPS
200.0000 mg | ORAL_CAPSULE | Freq: Two times a day (BID) | ORAL | Status: DC
  Administered 2022-06-02 – 2022-06-03 (×2): 200 mg via ORAL
  Filled 2022-06-02 (×2): qty 2

## 2022-06-02 MED ORDER — NALTREXONE HCL 50 MG PO TABS
25.0000 mg | ORAL_TABLET | Freq: Every day | ORAL | Status: DC
  Administered 2022-06-02 – 2022-06-03 (×2): 25 mg via ORAL
  Filled 2022-06-02 (×2): qty 1

## 2022-06-02 MED ORDER — NICOTINE 21 MG/24HR TD PT24
21.0000 mg | MEDICATED_PATCH | Freq: Once | TRANSDERMAL | Status: DC
  Administered 2022-06-02: 21 mg via TRANSDERMAL
  Filled 2022-06-02: qty 1

## 2022-06-02 MED ORDER — NICOTINE 21 MG/24HR TD PT24
MEDICATED_PATCH | TRANSDERMAL | Status: AC
  Filled 2022-06-02: qty 1

## 2022-06-02 MED ORDER — HYDROCHLOROTHIAZIDE 25 MG PO TABS
25.0000 mg | ORAL_TABLET | Freq: Every day | ORAL | Status: DC
  Administered 2022-06-03: 25 mg via ORAL
  Filled 2022-06-02: qty 1

## 2022-06-02 MED ORDER — IRBESARTAN 150 MG PO TABS
300.0000 mg | ORAL_TABLET | Freq: Every day | ORAL | Status: DC
  Administered 2022-06-03: 300 mg via ORAL
  Filled 2022-06-02 (×2): qty 2

## 2022-06-02 MED ORDER — TRAZODONE HCL 50 MG PO TABS: 50.0000 mg | ORAL_TABLET | Freq: Every evening | ORAL | Status: DC | PRN

## 2022-06-02 MED ORDER — THIAMINE MONONITRATE 100 MG PO TABS: 100.0000 mg | ORAL_TABLET | Freq: Every day | ORAL | Status: DC

## 2022-06-02 MED ORDER — MOMETASONE FURO-FORMOTEROL FUM 100-5 MCG/ACT IN AERO
2.0000 | INHALATION_SPRAY | Freq: Two times a day (BID) | RESPIRATORY_TRACT | Status: DC
  Administered 2022-06-02 – 2022-06-03 (×2): 2 via RESPIRATORY_TRACT
  Filled 2022-06-02: qty 8.8

## 2022-06-02 MED ORDER — TRAZODONE HCL 50 MG PO TABS
50.0000 mg | ORAL_TABLET | Freq: Every evening | ORAL | Status: DC | PRN
  Administered 2022-06-02: 50 mg via ORAL
  Filled 2022-06-02: qty 1

## 2022-06-02 MED ORDER — HYDROXYZINE HCL 25 MG PO TABS: 25.0000 mg | ORAL_TABLET | Freq: Four times a day (QID) | ORAL | Status: DC | PRN

## 2022-06-02 MED ORDER — FAMOTIDINE 20 MG PO TABS
40.0000 mg | ORAL_TABLET | Freq: Two times a day (BID) | ORAL | Status: DC
  Administered 2022-06-02 – 2022-06-03 (×2): 40 mg via ORAL
  Filled 2022-06-02 (×3): qty 2

## 2022-06-02 NOTE — ED Notes (Signed)
Patient was admitted to O[facility] and initially changed his mind about staying to receive help with ETOH detox. Patient denies SI/HI and AVH. Patient is mild mannered and pleasant with staff. Patient spoke with his wife that encouraged him to stay and get the help he needed. Patient has been oriented to the unit. Patient is being monitored for safety.

## 2022-06-02 NOTE — Progress Notes (Signed)
   06/02/22 0807  [facility] Triage Screening (Walk-ins at [facility] only)  How Did You Hear About [place]? Self  What Is the Reason for Your Visit/Call Today? Adam Esparza is a 52 year old male presenting to [facility] with chief complaint of alcohol use. Patient reports that he has"hit rock bottom" and needs help. 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 Patient reports the stress from work caused him to drink. Patient reports drinking a fifth or more of whisky a day for the past six years. Patient reports his drinking has caused issues in his marriage and he is a"asshole" when he drinks. Patient reports he got into an argument with his wife last night and this morning he knew he needed to get some help. Patient reports he went to his boss this morning and told him that he was an alcoholic and he needed to get help. Patient reports his boss said he understood and his job was secure but he felt embarrassed to share his struggles with alcohol. Patient reports that his drinking has caused many issues with his relationships with his wife and kids and reports that he does not want to work or eat and feels like he "living out of the bottle". Pt does not have outpatient services. Denies SI, HI, AVH. Pt denies psych inpt treatment. Was going to AA a year ago but reports it was not working for him. Pt denies withdrawal symptoms, however reports he drinks until he blacks out and can't remember what happened.  How Long Has This Been Causing You Problems? > than 6 months  Have You Recently Had Any Thoughts About Hurting Yourself? No  Are You Planning to Commit Suicide/Harm Yourself At This time? No  Have you Recently Had Thoughts About Hurting Someone Karolee Ohs? No  Are You Planning To Harm Someone At This Time? No  Are you currently experiencing any auditory, visual or other hallucinations? No  Have You Used Any Alcohol or Drugs in the  Past 24 Hours? Yes  How long ago did you use Drugs or Alcohol? last night  What Did You Use and How Much? drunk fifth of liqour yesterday  Do you have any current medical co-morbidities that require immediate attention? No  Clinician description of patient physical appearance/behavior: calm  What Do You Feel Would Help You the Most Today? Treatment for Depression or other mood problem  If access to [facility] was not available, would you have sought care in the Emergency Department? No  Determination of Need Routine (7 days)  Options For Referral Medication Management;Outpatient Therapy;Facility-Based Crisis

## 2022-06-02 NOTE — ED Notes (Signed)
Pt sleeping at present, no distress noted.  Monitoring for safety. 

## 2022-06-02 NOTE — Discharge Instructions (Addendum)
.. OBS Care Management   Base on the information you have provided and the presenting issue, outpatient services and resources for have been recommended.  It is imperative that you follow through with treatment recommendations within 5-7 days from the of discharge to mitigate further risk to your safety and mental well-being. A list of referrals has been provided below to get you started.  You are not limited to the list provided.  In case of an urgent crisis, you may contact the Mobile Crisis Unit with [facility] at [phone].    Residential Treatment  Facilities Medicaid Detox No Insurance Private Insurance   [facility] [address] [place], [place] [phone] or [phone]   No  Yes  Yes  Yes   [facility] [address] [place], [place] [address] [phone] Admissions: 8am-3pm  M-F   [place] only  No  Yes  No    [facility] [phone]   No  Yes  No- out of pocket 16,000  Yes   [facility]) [address] [place], [place] [phone]   Yes- No medicare  Yes   Yes, [place]   BCBS only    [facility] [address] [place], [place], [address] [phone]   No  No  Yes but private pay, offers some sponsorships  Does not take insurance    [facility] [place], [place] [phone]       No  No  Yes- [place] out of pocket if not in those counties. 3,220.00 for 28 days.   No   Residential Treatment  Facilities   Medicaid  Detox  No Insurance  Private Insurance   [facility] (multiple locations throughout the country)  Intake: [phone]    No   Yes   No   Yes   [facility]  [facility] [place], [place] [phone] (takes everyone as long as they meet detox criteria)   Yes  Yes   Yes  Yes   [facility]   [place], [place]  [phone] 27 locations    No  No- sober living house  90.00-130.00 per week per person  Will Madison  [facility] [place] [facility] [place] [facility] [phone] [email or web address]  Tony Sowards  [facility] [place] [facility] [place] [facility] [phone] [email or web address]    No   [place] [facility] [address]  [place] [phone] [email or web address]     No  No  1,200.00 a 200.00 deposit is required at start of treatment Payment plans accepted **Christian based program  No   Residential Treatment  Facilities   Medicaid  Detox  No Insurance  Private Insurance   [facility] [place] [address]  [place], [place], [address] [phone]  No  Yes       Yes  Regular Rehab: 7,500.00 28 days Dual Diagnosis: 8,900.00 28 days 7 day detox: 2.700.00 or 3,400.00 for Dual Diagnosis   Yes   Outpatient Treatment  Facilities Medicaid Detox No Insurance Private  Insurance   [facility] [address]  [place], [place], [address] [phone]   No  No  No  Yes    [facility]  (If substance abuse is secondary diagnosis) [address],  [place],  [address] [phone]   Yes-[place] and [place] Only for Partial   No   No   Yes- IOP  [facility]   [facility]  [address] [place], [place] [phone] (offers adult AND adolescent Intensive Outpatient services) (also in [place], [place], [place] and [place])      No No IOP- does some sponsorships on individual basis Yes   Outpatient Treatment  Facilities Medicaid Detox No Insurance Private  Insurance   [facility] [address]  [place], [place], [address] [phone]   Yes  They would go to ER at [facility] then be transferred to a detox unit    Yes- self pay     Yes    [facility] [address]  [place], [place], [address] And  [address],  [place], [place] [address] [phone]   Yes  No    Yes most qualify for state funding IOP and Opiod treatment- Offers Methadone  UHC, [employer] BCBS, [place]   [facility]  [phone]   No  Yes (in residential treatment program)   No   Insurance Only    [facility] [address] [place], [place],  [phone]   Yes but not for suboxone treatment  Yes- opiates with suboxone have to commit to 8 week IOP   Yes 595.00 for first visit  150.00 for prescription 150.00 a week after that for group    Yes   [facility] [address]  [place], [place] [phone]  No- has a waiting list about to be approved No Yes- but has to be self pay  500.00 for 1st 2 weeks  500 for next 2 weeks and  750 mo. Ongoing Yes   Outpatient Treatment  Facilities Medicaid Detox No Insurance Private  Insurance   [facility] [address] [place], [place] [phone]  No No Limited sponsorships IOP- 9,500.00 8-15 weeks If paid upfront gives a 500.00 deduction Outpatient- 1 day a week  9 weeks 4,500.00 has payment plans   Yes- out of network though   [facility] (Groups/Residential) [place], [place]  [phone]  Yes- [place] No IOP facility  Yes  No   [facility]  [address] [place], [place] [phone]  No No Out of pocket only- depends on the situation  Allow people to do services on a flexible  payment plan- billed every 90 days based on income  35.00 per group- 2 hour session  *DUI assessments  and *education for charges  *evaluations   No  Outpatient Treatment  Facilities Medicaid Detox No Insurance Private  Insurance   [facility]  [address]  , [place], [address] [phone]   Yes  No  Yes  Yes    Mobile Crisis: [facility]: [phone]  (For crisis response 24 hours a  day) [place] Hotline: 1-800-256-2452  

## 2022-06-02 NOTE — ED Provider Notes (Signed)
[facility] Continuous Assessment Admission H&P  Date: 06/02/22 Patient Name: Adam Esparza Adam Esparza MRN: [id number] Chief Complaint: "Today was the day, I new I had to come"  Diagnoses:  Final diagnoses:  Alcohol use disorder, severe, dependence   HPI:  Adam Esparza [place] 52 y.o., male patient [facility] as a walk in, voluntarily,  unaccompanied requesting detox from alcohol.    Adam Esparza, 52 y.o., male patient seen face to face by this provider, consulted with Dr. Lucianne MussKumar; and chart reviewed on 06/02/22.    On evaluation Adam Esparza Camey reports, a long history of alcohol use however, notes that the abuse of alcohol started approximately  8 years ago after accepting a demanding, high stress job and coming home and drinking a mixed drink daily and the quantity of the drinks and frequency of drinking gradually increased. He reports keeping "high stressed" job for only 24 months however the alcohol consumption progressively worsened and now he drinks all time if he is not working. He reports on the weekends he drinks as soon as he wakes up in the morning and continues throughout the day until he passes out or blacks out.  He reports drinking liquor specifically whiskey and estimates at least a 5th liquor per day  if not more. Patient when asked if he depressed, he states, " I don't know what I feel because I'm drinking", Patient becomes tearful during evaluation as he reports he is not a nice person when he is drinking and his wife of 35 years has been so patient but states. "I've got to stop drinking, my family doesn't deserve this". He endorses anhedonia and no longer engages in activities he and his wife use to enjoy. He reports no prior history of alcohol detox although attempted multiple times to quit. PCP started him on Natrexone two weeks ago and he was unable to start the medication because of the cost of co-pay. He denies any know history of seizures or DT. Patient suffers from  hypertension and COPD. Patient denies any thoughts of killing himself, harming or killing others, hallucinations (auditory and visual).   CCA by Counselor Darcey NoraFalencio Thompson, [facility]  Darnelle SpangleChadwick Mcquilkin is a 52 year old male [facility] alcohol use. Patient reports that he has"hit rock bottom" and needs help. 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 Patient reports the stress from work caused him to drink. Patient reports drinking a fifth or more of whisky a day for the past six years. Patient reports his drinking has caused issues in his marriage and he is a"asshole" when he drinks. Patient reports he got into an argument with his wife last night and this morning he knew he needed to get some help. Patient reports he went to his boss this morning and told him that he was an alcoholic and he needed to get help. Patient reports his boss said he understood and his job was secure but he felt embarrassed to share his struggles with alcohol. Patient reports that his drinking has caused many issues with his relationships with his wife and kids and reports that he does not want to work or eat and feels like he "living out of the bottle". Pt does not have outpatient services. Denies SI, HI, AVH. Pt denies psych inpt treatment. Was going to AA a year ago but reports it was not working for  him. Pt denies withdrawal symptoms, however reports he drinks until he blacks out and can't remember what happened   During evaluation Adam Esparza is sitting upright in chair,  in no acute distress. He is alert, oriented x 4, anxious, cooperative and attentive.  His mood depressed with depressed and tearful affect. He  has normal speech, and appropriate behavior.  Objectively there is no evidence of psychosis/mania or delusional thinking.  Patient is able to converse coherently, goal directed thoughts, no  distractibility, or pre-occupation.  He denies suicidal/self-harm/homicidal ideation, psychosis, and paranoia.  Patient answered question appropriately.  Patient is able to contract for safety and will be admitted to [facility] once bed availability. For initially detox patient admitted to continuous assessment unit. Patient initially requested discharge and subsequently decided to stay and to be transferred to [facility] tomorrow.  Total Time spent with patient: 1 hour  Musculoskeletal  Strength & Muscle Tone: within normal limits Gait & Station: normal Patient leans: N/A  Psychiatric Specialty Exam  Presentation General Appearance:  Appropriate for Environment  Eye Contact: Good  Speech: Clear and Coherent  Speech Volume: Normal  Handedness: Right   Mood and Affect  Mood: Anxious  Affect: Congruent   Thought Process  Thought Processes: Coherent  Descriptions of Associations:Intact  Orientation:Full (Time, Place and Person)  Thought Content:Logical    Hallucinations:Hallucinations: Other (comment)  Ideas of Reference:None  Suicidal Thoughts:Suicidal Thoughts: No  Homicidal Thoughts:Homicidal Thoughts: No   Sensorium  Memory: Immediate Good; Recent Good; Remote Good  Judgment: Fair  Insight: Fair   Art therapist  Concentration: Good  Attention Span: Good  Recall: Good  Fund of Knowledge: Good  Language: Good   Psychomotor Activity  Psychomotor Activity: Psychomotor Activity: Normal   Assets  Assets: Desire for Improvement   Sleep  Sleep: Sleep: Fair   Nutritional Assessment (For OBS and FBC admissions only) Has the patient had a weight loss or gain of 10 pounds or more in the last 3 months?: No Has the patient had a decrease in food intake/or appetite?: No Does the patient have dental problems?: No Does the patient have eating habits or behaviors that may be indicators of an eating disorder including binging or inducing  vomiting?: No Has the patient recently lost weight without trying?: 0 Has the patient been eating poorly because of a decreased appetite?: 0 Malnutrition Screening Tool Score: 0    Physical Exam Vitals reviewed.  Constitutional:      Appearance: Normal appearance.  HENT:     Head: Normocephalic and atraumatic.     Right Ear: External ear normal.     Nose: Nose normal.  Eyes:     General: No scleral icterus.    Extraocular Movements: Extraocular movements intact.     Pupils: Pupils are equal, round, and reactive to light.  Cardiovascular:     Rate and Rhythm: Tachycardia present.  Pulmonary:     Effort: Pulmonary effort is normal.     Breath sounds: Normal breath sounds.  Musculoskeletal:        General: Normal range of motion.     Cervical back: Normal range of motion.  Neurological:     General: No focal deficit present.     Mental Status: He is alert.     Review of Systems  Psychiatric/Behavioral:  Positive for substance abuse. Negative for depression, hallucinations, memory loss and suicidal ideas. The patient is nervous/anxious and has insomnia.     Blood pressure 118/79, pulse (!) 101, temperature 97.6  F (36.4 C), temperature source Oral, resp. rate 19, SpO2 96 %. There is no height or weight on file to calculate BMI.  Past Psychiatric History: Patient denies any significant mental health history. Alcohol use disorder x 8 years.  Is the patient at risk to self? No  Has the patient been a risk to self in the past 6 months? No .    Has the patient been a risk to self within the distant past? No   Is the patient a risk to others? No   Has the patient been a risk to others in the past 6 months? No   Has the patient been a risk to others within the distant past? No   Past Medical History: COPD, Hypertension    Social History: Lives with spouse. Employed Full time.  Last Labs:  Admission on 06/02/2022  Component Date Value Ref Range Status   SARS Coronavirus 2  by RT PCR 06/02/2022 NEGATIVE  NEGATIVE Final   Performed at [facility], [address], [place], [place] [address]   POC Amphetamine UR 06/02/2022 None Detected  NONE DETECTED (Cut Off Level 1000 ng/mL) Final   POC Secobarbital (BAR) 06/02/2022 None Detected  NONE DETECTED (Cut Off Level 300 ng/mL) Final   POC Buprenorphine (BUP) 06/02/2022 None Detected  NONE DETECTED (Cut Off Level 10 ng/mL) Final   POC Oxazepam (BZO) 06/02/2022 None Detected  NONE DETECTED (Cut Off Level 300 ng/mL) Final   POC Cocaine UR 06/02/2022 None Detected  NONE DETECTED (Cut Off Level 300 ng/mL) Final   POC Methamphetamine UR 06/02/2022 None Detected  NONE DETECTED (Cut Off Level 1000 ng/mL) Final   POC Morphine 06/02/2022 None Detected  NONE DETECTED (Cut Off Level 300 ng/mL) Final   POC Methadone UR 06/02/2022 None Detected  NONE DETECTED (Cut Off Level 300 ng/mL) Final   POC Oxycodone UR 06/02/2022 None Detected  NONE DETECTED (Cut Off Level 100 ng/mL) Final   POC Marijuana UR 06/02/2022 None Detected  NONE DETECTED (Cut Off Level 50 ng/mL) Final   Color, Urine 06/02/2022 YELLOW  YELLOW Final   APPearance 06/02/2022 CLEAR  CLEAR Final   Specific Gravity, Urine 06/02/2022 1.009  1.005 - 1.030 Final   pH 06/02/2022 5.0  5.0 - 8.0 Final   Glucose, UA 06/02/2022 NEGATIVE  NEGATIVE mg/dL Final   Hgb urine dipstick 06/02/2022 NEGATIVE  NEGATIVE Final   Bilirubin Urine 06/02/2022 NEGATIVE  NEGATIVE Final   Ketones, ur 06/02/2022 NEGATIVE  NEGATIVE mg/dL Final   Protein, ur 64/15/8309 NEGATIVE  NEGATIVE mg/dL Final   Nitrite 40/76/8088 NEGATIVE  NEGATIVE Final   Leukocytes,Ua 06/02/2022 NEGATIVE  NEGATIVE Final   Performed at [facility], [address], [place], [place] [address]  Orders Only on 05/17/2022  Component Date Value Ref Range Status   Glucose 05/17/2022 78  70 - 99 mg/dL Final   BUN 59/45/8592 8  6 - 24 mg/dL Final   Creatinine, Ser 05/17/2022 0.91  0.76 - 1.27 mg/dL Final   eGFR  92/44/6286 101  >59 mL/min/1.73 Final   BUN/Creatinine Ratio 05/17/2022 9  9 - 20 Final   Sodium 05/17/2022 142  134 - 144 mmol/L Final   Potassium 05/17/2022 4.4  3.5 - 5.2 mmol/L Final   Chloride 05/17/2022 101  96 - 106 mmol/L Final   CO2 05/17/2022 23  20 - 29 mmol/L Final   Calcium 05/17/2022 9.0  8.7 - 10.2 mg/dL Final   Total Protein 38/17/7116 6.9  6.0 - 8.5 g/dL Final   Albumin 16/11/9602 4.5  3.8 - 4.9 g/dL Final   Globulin, Total 05/17/2022 2.4  1.5 - 4.5 g/dL Final   Albumin/Globulin Ratio 05/17/2022 1.9  1.2 - 2.2 Final   Bilirubin Total 05/17/2022 0.2  0.0 - 1.2 mg/dL Final   Alkaline Phosphatase 05/17/2022 71  44 - 121 IU/L Final   AST 05/17/2022 28  0 - 40 IU/L Final   ALT 05/17/2022 23  0 - 44 IU/L Final    Allergies: Amoxicillin, Percocet [oxycodone-acetaminophen], and Sulfa antibiotics  Medications:  Facility Ordered Medications  Medication   cloNIDine (CATAPRES) tablet 0.1 mg   thiamine (VITAMIN B1) injection 100 mg   [START ON 06/03/2022] thiamine (VITAMIN B1) tablet 100 mg   multivitamin with minerals tablet 1 tablet   LORazepam (ATIVAN) tablet 1 mg   loperamide (IMODIUM) capsule 2-4 mg   ondansetron (ZOFRAN-ODT) disintegrating tablet 4 mg   LORazepam (ATIVAN) tablet 1 mg   Followed by   Melene Muller ON 06/04/2022] LORazepam (ATIVAN) tablet 1 mg   Followed by   Melene Muller ON 06/05/2022] LORazepam (ATIVAN) tablet 1 mg   Followed by   Melene Muller ON 06/06/2022] LORazepam (ATIVAN) tablet 1 mg   nicotine (NICODERM CQ - dosed in mg/24 hours) patch 21 mg   nicotine (NICODERM CQ - dosed in mg/24 hours) 21 mg/24hr patch   gabapentin (NEURONTIN) capsule 200 mg   [START ON 06/03/2022] amLODipine (NORVASC) tablet 10 mg   [START ON 06/03/2022] irbesartan (AVAPRO) tablet 300 mg   famotidine (PEPCID) tablet 40 mg   mometasone-formoterol (DULERA) 100-5 MCG/ACT inhaler 2 puff   naltrexone (DEPADE) tablet 25 mg   [START ON 06/03/2022] hydrochlorothiazide (HYDRODIURIL) tablet 25 mg    traZODone (DESYREL) tablet 50 mg   PTA Medications  Medication Sig   fluticasone-salmeterol (ADVAIR DISKUS) 100-50 MCG/ACT AEPB Inhale 1 puff into the lungs 2 (two) times daily.   amLODipine-valsartan (EXFORGE) 10-320 MG tablet Take 1 tablet by mouth daily.   naltrexone (DEPADE) 50 MG tablet Take 0.5 tablets (25 mg total) by mouth daily.   hydrochlorothiazide (HYDRODIURIL) 25 MG tablet Take 1 tablet (25 mg total) by mouth daily.   famotidine (PEPCID) 40 MG tablet Take 1 tablet (40 mg total) by mouth 2 (two) times daily.    Medical Decision Making  Alcohol Use Disorder. Severe, restart Naltrexone  25 mg daily, Ativan withdrawal protocol initiated, CIWA protocol. Safety monitoring continuously during initial detox. Patient will transfer to facility based crisis center 06/03/22. Patient advised that he will be required to sign a 72 hour consent for admission as he was wondering if he could leave of check whenever he wished. Educated at length the program is 5-7 days and engaging in the full admission will predict the best outcomes in preventing relapse.   Patient has initially been discharged per his request and all orders discontinued.  Will re-orders labs initially placed with admission to be collected AM.  Restarted home medications for chronic conditions See MAR/diagnosis. ECG Sinus Tachycardia 101 HR, QTC 451 Recommendations  Based on my evaluation the patient does not appear to have an emergency medical condition.   Joaquin Courts, FNP-C, PMHNP-BC  Behavioral Health Service Line  [facility] Urgent [phone]  06/02/22 9:40 PM

## 2022-06-02 NOTE — BH Assessment (Signed)
Comprehensive Clinical Assessment (CCA) Note  06/02/2022 [name] [id number]  Disposition: Per Joaquin Courts, NP, patient is recommended for admission to overnight observation and [facility].   The patient demonstrates the following risk factors for suicide: Chronic risk factors for suicide include: substance use disorder. Acute risk factors for suicide include: family or marital conflict. Protective factors for this patient include: positive social support, responsibility to others (children, family), and hope for the future. Considering these factors, the overall suicide risk at this point appears to be low. Patient is not appropriate for outpatient follow up.  Chief Complaint:  Chief Complaint  Patient presents with   Alcohol Problem   Visit Diagnosis: Alcohol abuse     CCA Screening, Triage and Referral (STR)  Patient Reported Information How did you hear about [place]? Self  What Is the Reason for Your Visit/Call Today? Adam Esparza is a 52 year old male presenting to [facility] with chief complaint of alcohol use. Patient was here earlier today and recommended for admission to the [facility]. However, changed his mind, "Because I wasn't prepared to be here and needed to get my affairs in order".  Patient reports the same information as shared earlier today during his triage/screening, "I hit rock bottom and need help".  Patient first started drinking in his mid 20's and reports that his drinking increased about 8-9 years ago after a job change. Patient works in Education officer, museum. Patient reports the stress from work caused him to drink. Patient reports drinking a fifth or more of whisky a day for the past six years. Patient's last drink was "4 hrs ago", prior to his arrival. He reports drinking 2 shots of liquor. Pt denies withdrawal symptoms, however reports he drinks until he blacks out and can't remember what happened. No history of seizures or DT's. Patient reports his drinking has caused issues  in his marriage. Patient reports he got into an argument with his wife last night and this morning he knew he needed to get some help. Denies SI, HI, AVH. Pt denies that he has a mental health diagnoses. He does not have a therapist or psychiatrist.     *Patient states that he is ready to come in but wants to know before admitting himself if he can get up and leave any time he desires. Also, states that he needs to be discharged not later than this Sunday, possibly Monday because he has a upcoming job interview.  Patient is oriented x4, engaged, alert and cooperative during assessment. Patient eye contact and speech is normal, his affect is euthymic with congruent mood. Patient denies SI, HI, AVH and drug use.   How Long Has This Been Causing You Problems? > than 6 months  What Do You Feel Would Help You the Most Today? Treatment for Depression or other mood problem   Have You Recently Had Any Thoughts About Hurting Yourself? No  Are You Planning to Commit Suicide/Harm Yourself At This time? No   Flowsheet Row ED from 06/02/2022 in [facility] Most recent reading at 06/02/2022  1:49 PM ED from 06/02/2022 in [facility] Most recent reading at 06/02/2022  8:15 AM  C-SSRS RISK CATEGORY No Risk No Risk       Have you Recently Had Thoughts About Hurting Someone Karolee Ohs? No  Are You Planning to Harm Someone at This Time? No  Explanation: No data recorded  Have You Used Any Alcohol or Drugs in the Past 24 Hours? Yes  What Did You Use and How Much? 4hrs prior to this visit patient states that he drank 2 air plane bottles of whisky (equivalent to 2 shots)   Do You Currently Have a Therapist/Psychiatrist? No data recorded Name of Therapist/Psychiatrist:    Have You Been Recently Discharged From Any Office Practice or Programs? No data recorded Explanation of Discharge From Practice/Program: No data recorded    CCA Screening Triage  Referral Assessment Type of Contact: No data recorded Telemedicine Service Delivery:   Is this Initial or Reassessment?   Date Telepsych consult ordered in [facility]:    Time Telepsych consult ordered in [facility]:    Location of Assessment: No data recorded Provider Location: No data recorded  Collateral Involvement: No data recorded  Does Patient Have a Court Appointed Legal Guardian? No data recorded Legal Guardian Contact Information: No data recorded Copy of Legal Guardianship Form: No data recorded Legal Guardian Notified of Arrival: No data recorded Legal Guardian Notified of Pending Discharge: No data recorded If Minor and Not Living with Parent(s), Who has Custody? No data recorded Is CPS involved or ever been involved? No data recorded Is APS involved or ever been involved? No data recorded  Patient Determined To Be At Risk for Harm To Self or Others Based on Review of Patient Reported Information or Presenting Complaint? No data recorded Method: No data recorded Availability of Means: No data recorded Intent: No data recorded Notification Required: No data recorded Additional Information for Danger to Others Potential: No data recorded Additional Comments for Danger to Others Potential: No data recorded Are There Guns or Other Weapons in Your Home? No data recorded Types of Guns/Weapons: No data recorded Are These Weapons Safely Secured?                            No data recorded Who Could Verify You Are Able To Have These Secured: No data recorded Do You Have any Outstanding Charges, Pending Court Dates, Parole/Probation? No data recorded Contacted To Inform of Risk of Harm To Self or Others: No data recorded   Does Patient Present under Involuntary Commitment? No data recorded   [place] of Residence: No data recorded  Patient Currently Receiving the Following Services: No data recorded  Determination of Need: Urgent (48 hours)   Options For Referral: Medication  Management; Facility-Based Crisis; Chemical Dependency Intensive Outpatient Therapy (CDIOP); Outpatient Therapy     CCA Biopsychosocial Patient Reported Schizophrenia/Schizoaffective Diagnosis in Past: No   Strengths: motivated for treatment   Mental Health Symptoms Depression:   Change in energy/activity; Hopelessness; Irritability; Sleep (too much or little); Tearfulness; Worthlessness   Duration of Depressive symptoms:  Duration of Depressive Symptoms: Greater than two weeks   Mania:   None   Anxiety:    Worrying; Tension   Psychosis:   None   Duration of Psychotic symptoms:    Trauma:   None   Obsessions:   None   Compulsions:   None   Inattention:   None   Hyperactivity/Impulsivity:   None   Oppositional/Defiant Behaviors:   None   Emotional Irregularity:   None   Other Mood/Personality Symptoms:  No data recorded   Mental Status Exam Appearance and self-care  Stature:   Tall   Weight:   Average weight   Clothing:   Age-appropriate   Grooming:   Normal   Cosmetic use:   None   Posture/gait:   Normal   Motor activity:  Not Remarkable   Sensorium  Attention:   Normal   Concentration:   Normal   Orientation:   Person; Situation; Time; Place   Recall/memory:   Normal   Affect and Mood  Affect:   Tearful; Appropriate   Mood:   Euthymic   Relating  Eye contact:   Normal   Facial expression:   Responsive   Attitude toward examiner:   Cooperative   Thought and Language  Speech flow:  Clear and Coherent   Thought content:   Appropriate to Mood and Circumstances   Preoccupation:   None   Hallucinations:  No data recorded  Organization:   Coherent   Affiliated Computer ServicesExecutive Functions  Fund of Knowledge:   Fair   Intelligence:   Average   Abstraction:   Normal   Judgement:   Fair   Dance movement psychotherapisteality Testing:   Adequate   Insight:   Fair   Decision Making:   Normal   Social Functioning  Social Maturity:    Responsible   Social Judgement:   Normal   Stress  Stressors:   Family conflict; Work   Coping Ability:   Normal   Skill Deficits:   None   Supports:   Family; Support needed     Religion: Religion/Spirituality Are You A Religious Person?: Yes What is Your Religious Affiliation?: Christian  Leisure/Recreation: Leisure / Recreation Do You Have Hobbies?: No  Exercise/Diet: Exercise/Diet Do You Exercise?: No Have You Gained or Lost A Significant Amount of Weight in the Past Six Months?: No Do You Follow a Special Diet?: No Do You Have Any Trouble Sleeping?: No   CCA Employment/Education Employment/Work Situation: Employment / Work Clinical biochemistituation Patient's Job has Been Impacted by Current Illness: No Has Patient ever Been in Equities traderthe Military?: No  Education: Education Is Patient Currently Attending School?: No Last Grade Completed: 12 Did You Product managerAttend College?: No Did You Have An Individualized Education Program (IIEP): No Did You Have Any Difficulty At School?: No Patient's Education Has Been Impacted by Current Illness: No   CCA Family/Childhood History Family and Relationship History: Family history Marital status: Married What types of issues is patient dealing with in the relationship?: pt reports his drinking is causing issues in his marriage Additional relationship information: NA Does patient have children?: Yes How many children?: 2 How is patient's relationship with their children?: OK  Childhood History:  Childhood History By whom was/is the patient raised?: Mother Did patient suffer any verbal/emotional/physical/sexual abuse as a child?: No Did patient suffer from severe childhood neglect?: No Has patient ever been sexually abused/assaulted/raped as an adolescent or adult?: No Was the patient ever a victim of a crime or a disaster?: No Witnessed domestic violence?: No Has patient been affected by domestic violence as an adult?: No       CCA  Substance Use Alcohol/Drug Use: Alcohol / Drug Use Pain Medications: SEE MAR Prescriptions: SEE MAR Over the Counter: SEE MAR History of alcohol / drug use?: Yes Withdrawal Symptoms: Blackouts, Change in blood pressure, Irritability Substance #1 Name of Substance 1: ALCOHOL 1 - Age of First Use: 20'S 1 - Amount (size/oz): FIFTH OF WHISKY 1 - Frequency: DAILY 1 - Duration: ONGOING 1 - Last Use / Amount: TODAY/COUPLE SHOTS 1 - Method of Aquiring: BUYING 1- Route of Use: DRINKING                       ASAM's:  Six Dimensions of Multidimensional Assessment  Dimension 1:  Acute  Intoxication and/or Withdrawal Potential:      Dimension 2:  Biomedical Conditions and Complications:      Dimension 3:  Emotional, Behavioral, or Cognitive Conditions and Complications:     Dimension 4:  Readiness to Change:     Dimension 5:  Relapse, Continued use, or Continued Problem Potential:     Dimension 6:  Recovery/Living Environment:     ASAM Severity Score:    ASAM Recommended Level of Treatment: ASAM Recommended Level of Treatment: Level III Residential Treatment   Substance use Disorder (SUD) Substance Use Disorder (SUD)  Checklist Symptoms of Substance Use: Continued use despite persistent or recurrent social, interpersonal problems, caused or exacerbated by use, Persistent desire or unsuccessful efforts to cut down or control use, Presence of craving or strong urge to use  Recommendations for Services/Supports/Treatments: Recommendations for Services/Supports/Treatments Recommendations For Services/Supports/Treatments: Detox, Facility Based Crisis  Discharge Disposition: Discharge Disposition Medical Exam completed: Yes Disposition of Patient: Admit Mode of transportation if patient is discharged/movement?: Car  DSM5 Diagnoses: Patient Active Problem List   Diagnosis Date Noted   Gastroesophageal reflux disease without esophagitis 05/31/2022   Alcohol use disorder  05/17/2022   HTN (hypertension) 05/02/2022   Alcohol abuse 05/02/2022   Chronic obstructive pulmonary disease (COPD) 05/02/2022     Referrals to Alternative Service(s): Referred to Alternative Service(s):   Place:   Date:   Time:    Referred to Alternative Service(s):   Place:   Date:   Time:    Referred to Alternative Service(s):   Place:   Date:   Time:    Referred to Alternative Service(s):   Place:   Date:   Time:     Audree Camel, [facility]

## 2022-06-02 NOTE — Progress Notes (Signed)
   06/02/22 1347  BHUC Triage Screening (Walk-ins at [facility] only)  How Did You Hear About [place]? Self  What Is the Reason for Your Visit/Call Today? Adam Esparza is a 52 year old male presenting to [facility] with chief complaint of alcohol use. Patient was here earlier today and recommended for admission to the [facility]. However, changed his mind, "Because I wasn't prepared to be here and needed to get my affairs in order".  Patient reports the same information as shared earlier today during his triage/screening, "I hit rock bottom and need help".  Patient first started drinking in his mid 20's and reports that his drinking increased about 8-9 years ago after a job change. Patient works in Education officer, museum. Patient reports the stress from work caused him to drink. Patient reports drinking a fifth or more of whisky a day for the past six years. Patient's last drink was "4 hrs ago", prior to his arrival. He reports drinking 2 shots of liquor. Pt denies withdrawal symptoms, however reports he drinks until he blacks out and can't remember what happened. No history of seizures or DT's. Patient reports his drinking has caused issues in his marriage. Patient reports he got into an argument with his wife last night and this morning he knew he needed to get some help. Denies SI, HI, AVH. Pt denies that he has a mental health diagnoses. He does not have a therapist or psychiatrist.     *Patient states that he is ready to come in but wants to know before admitting himself if he can get up and leave any time he desires. Also, states that he needs to be discharged not later than this Sunday, possibly Monday because he has a upcoming job interview.  How Long Has This Been Causing You Problems? > than 6 months  Have You Recently Had Any Thoughts About Hurting Yourself? No  Are You Planning to Commit Suicide/Harm Yourself At This time? No  Have you Recently Had Thoughts About Hurting Someone Karolee Ohs? No  Are You Planning To Harm Someone  At This Time? No  Are you currently experiencing any auditory, visual or other hallucinations? No  Have You Used Any Alcohol or Drugs in the Past 24 Hours? Yes  How long ago did you use Drugs or Alcohol? 4hrs prior to this visit patient states that he drank 2 air plane bottles of whisky (equivalent to 2 shots)  What Did You Use and How Much? 4hrs prior to this visit patient states that he drank 2 air plane bottles of whisky (equivalent to 2 shots)  Do you have any current medical co-morbidities that require immediate attention? No  Clinician description of patient physical appearance/behavior: Calm and cooperative.  What Do You Feel Would Help You the Most Today? Treatment for Depression or other mood problem  Determination of Need Urgent (48 hours)  Options For Referral Medication Management;Facility-Based Crisis;Chemical Dependency Intensive Outpatient Therapy (CDIOP);Outpatient Therapy

## 2022-06-02 NOTE — Discharge Instructions (Addendum)
Return at your convenience to be re-evaluated for admission to Facility Based Crisis . Therapy Walk-in Hours  Monday-Wednesday: 8 AM until slots are full  Friday: 1 PM to 5 PM  For Monday to Wednesday, it is recommended that patients arrive between 7:30 AM and 7:45 AM because patients will be seen in the order of arrival.  For Friday, we ask that patients arrive between 12 PM to 12:30 PM.  Go to the second floor on arrival and check in.  **Availability is limited; therefore, patients may not be seen on the same day.**  Medication management walk-ins:  Monday to Friday: 8 AM to 11 AM.  It is recommended that patients arrive by 7:30 AM to 7:45 AM because patients will be seen in the order of arrival.  Go to the second floor on arrival and check in.  **Availability is limited; therefore, patients may not be seen on the same day.**

## 2022-06-02 NOTE — ED Provider Notes (Signed)
Behavioral Health Urgent Care Medical Screening Exam  Patient Name: Adam Esparza MRN: [id number] Date of Evaluation: 06/02/22 Chief Complaint:   Diagnosis:  Final diagnoses:  Alcohol abuse    History of Present illness: Adam Esparza is a 52 y.o. male.  Presents to [facility] urgent care requesting to detox from alcohol.  Reports drinking 1/5 of whiskey daily.  States his last drink was last night.  Denies any withdrawal symptoms currently.  Denied history with delirium tremors denied any other illicit drug use.  States he is currently employed resides with his wife.  Denied that he has attended any other treatment facilities at this time.  He denies suicidal or homicidal ideations.  Denies auditory visual hallucinations.  Denies that he is followed by therapy and/or psychiatry currently.  [facility]  He declined.  Will make additional outpatient resources available. Patient stated that his wife doesn't have and money or support if he goes inpatient and he will try to come back at a later date.   During evaluation Adam Esparza is sitting in no acute distress. he is alert/oriented x 4; calm/cooperative; and mood congruent with affect. he is speaking in a clear tone at moderate volume, and normal pace; with good eye contact. His  thought process is coherent and relevant; There is no indication that he is currently responding to internal/external stimuli or experiencing delusional thought content; and he has denied suicidal/self-harm/homicidal ideation, psychosis, and paranoia.   Patient has remained calm throughout assessment and has answered questions appropriately.    At this time Adam Esparza is educated and verbalizes understanding of mental health resources and other crisis services in the community. He is instructed to call 911 and present to the nearest emergency room should he experience any suicidal/homicidal ideation,  auditory/visual/hallucinations, or detrimental worsening of his  mental health condition.he was a also advised by Clinical research associate that he could call the toll-free phone on insurance card to assist with identifying in network counselors and agencies or number on back of Medicaid card to  speak with care coordinator   Flowsheet Row ED from 06/02/2022 in [facility]  C-SSRS RISK CATEGORY No Risk       Psychiatric Specialty Exam  Presentation  General Appearance:Appropriate for Environment  Eye Contact:Good  Speech:Clear and Coherent  Speech Volume:Normal  Handedness:Right   Mood and Affect  Mood: Anxious  Affect: Congruent   Thought Process  Thought Processes: Coherent  Descriptions of Associations:Intact  Orientation:Full (Time, Place and Person)  Thought Content:Logical    Hallucinations:Other (comment)  Ideas of Reference:None  Suicidal Thoughts:No  Homicidal Thoughts:No   Sensorium  Memory: Immediate Good; Recent Good; Remote Good  Judgment: Fair  Insight: Fair   Art therapist  Concentration: Good  Attention Span: Good  Recall: Good  Fund of Knowledge: Good  Language: Good   Psychomotor Activity  Psychomotor Activity: Normal   Assets  Assets: Desire for Improvement   Sleep  Sleep: Fair  Number of hours: No data recorded  Physical Exam: Physical Exam Vitals and nursing note reviewed.  Cardiovascular:     Rate and Rhythm: Normal rate and regular rhythm.     Pulses: Normal pulses.     Heart sounds: Normal heart sounds.  Pulmonary:     Effort: Pulmonary effort is normal.     Breath sounds: Normal breath sounds.  Abdominal:     General: Abdomen is flat.  Neurological:  Mental Status: He is alert and oriented to person, place, and time.  Psychiatric:        Mood and Affect: Mood normal.        Behavior: Behavior normal.    Review of Systems  Psychiatric/Behavioral:  Positive for  substance abuse.   All other systems reviewed and are negative.  Blood pressure (!) 144/108, pulse 60, temperature 98 F (36.7 C), temperature source Oral, resp. rate 19, SpO2 100 %. There is no height or weight on file to calculate BMI.  Musculoskeletal: Strength & Muscle Tone: within normal limits Gait & Station: normal Patient leans: N/A   [facility] MSE Discharge Disposition for Follow up and Recommendations: Based on my evaluation the patient does not appear to have an emergency medical condition and can be discharged with resources and follow up care in outpatient services for Substance Abuse Intensive Outpatient Program, Individual Therapy, and Group Therapy   Oneta Rack, NP 06/02/2022, 9:05 AM

## 2022-06-02 NOTE — ED Notes (Signed)
Pt A&O x 4, no distress noted, calm & cooperative, awake & resting at present.  Monitoring for safety.

## 2022-06-03 MED ORDER — GABAPENTIN 100 MG PO CAPS: 200.0000 mg | ORAL_CAPSULE | Freq: Two times a day (BID) | ORAL | 0 refills | Status: DC

## 2022-06-03 MED ORDER — NICOTINE 21 MG/24HR TD PT24: 21.0000 mg | MEDICATED_PATCH | Freq: Once | TRANSDERMAL | 0 refills | Status: AC

## 2022-06-03 NOTE — Progress Notes (Signed)
Received Adam Esparza this AM awake sitting on the side of his chair bed, he requested and was given three cups of coffee. Later he was medicated per order. He requested to go hone and after talking with the NP he received his discharge order. He was presented with his AVS, questions answered, and was escorted to retrieved his personal belongings. He was escorted to the lobby to wait for his ride home.

## 2022-06-03 NOTE — ED Provider Notes (Signed)
FBC/OBS ASAP Discharge Summary  Date and Time: 06/03/2022 8:32 AM  Name: Adam Esparza  MRN:  [id number]   Discharge Diagnoses:  Final diagnoses:  Alcohol use disorder, severe, dependence  Essential hypertension  Chronic obstructive pulmonary disease, unspecified COPD type    Subjective: " I feel fine but I ready to go"  Stay Summary:  Adam Esparza 52 y.o., male patient presented to [facility] as a walk in on two separate occasions, 06/02/2022, unaccompanied, requesting detox from alcohol. Patient presented earlier today and requested to leave in order to get his "affairs in order".  Adam Esparza, 52 y.o., male patient seen face to face by this provider, consulted with Dr. Lucianne Muss; and chart reviewed on 06/03/22.    On evaluation SMOKEY MELOTT reports that he is ready for discharge, as he doesn't want to remain here at just "sitting here at the facility". He denies any active withdrawal symptoms or cravings. Felt the medications he received have helped with cravings or withdrawal symptoms. He is going to pick-up his prescription of Naltrexone and would like Gabapentin as well as he reports resting very well overnight. Patient has been at least 24 hours without alcohol and verbalizes that he feels this is an Neurosurgeon. Discussed with patient outpatient alcohol addiction treatment and meetings, reports he will think about it once he is out of here.   During evaluation Adam Esparza is sitting in upright position on the edge of the bed, in no acute distress. He is alert, oriented x 4, calm, cooperative and attentive. His mood is euthymic with congruent affect. His speech is normal, coherent, and clear with appropriate behavior.  Objectively there is no evidence of psychosis/mania or delusional thinking.  Patient is able to converse coherently, goal directed thoughts, no distractibility, or pre-occupation.  He also denies suicidal/self-harm/homicidal ideation, psychosis, and paranoia.   Patient answered question appropriately.      Total Time spent with patient: 30 minutes  Tobacco Cessation:  A prescription for an FDA-approved tobacco cessation medication provided at discharge  Current Medications:  Current Facility-Administered Medications  Medication Dose Route Frequency Provider Last Rate Last Admin   amLODipine (NORVASC) tablet 10 mg  10 mg Oral Daily Bing Neighbors, NP   10 mg at 06/03/22 4098   cloNIDine (CATAPRES) tablet 0.1 mg  0.1 mg Oral Once Alyson Reedy, FNP       famotidine (PEPCID) tablet 40 mg  40 mg Oral BID Bing Neighbors, NP   40 mg at 06/02/22 2028   gabapentin (NEURONTIN) capsule 200 mg  200 mg Oral BID Bing Neighbors, NP   200 mg at 06/02/22 2029   hydrochlorothiazide (HYDRODIURIL) tablet 25 mg  25 mg Oral Daily Bing Neighbors, NP   25 mg at 06/03/22 1191   irbesartan (AVAPRO) tablet 300 mg  300 mg Oral Daily Bing Neighbors, NP       loperamide (IMODIUM) capsule 2-4 mg  2-4 mg Oral PRN Bing Neighbors, NP       LORazepam (ATIVAN) tablet 1 mg  1 mg Oral Q6H PRN Bing Neighbors, NP       LORazepam (ATIVAN) tablet 1 mg  1 mg Oral QID Bing Neighbors, NP   1 mg at 06/02/22 2029   Followed by   Melene Muller ON 06/04/2022] LORazepam (ATIVAN) tablet 1 mg  1 mg Oral TID Bing Neighbors, NP       Followed by   Melene Muller ON 06/05/2022]  LORazepam (ATIVAN) tablet 1 mg  1 mg Oral BID Bing Neighbors, NP       Followed by   Melene Muller ON 06/06/2022] LORazepam (ATIVAN) tablet 1 mg  1 mg Oral Daily Bing Neighbors, NP       mometasone-formoterol (DULERA) 100-5 MCG/ACT inhaler 2 puff  2 puff Inhalation BID Bing Neighbors, NP   2 puff at 06/02/22 2028   multivitamin with minerals tablet 1 tablet  1 tablet Oral Daily Bing Neighbors, NP   1 tablet at 06/02/22 1743   naltrexone (DEPADE) tablet 25 mg  25 mg Oral Daily Bing Neighbors, NP   25 mg at 06/02/22 2029   nicotine (NICODERM CQ - dosed in mg/24 hours) patch 21 mg  21 mg  Transdermal Once Bing Neighbors, NP   21 mg at 06/02/22 1742   ondansetron (ZOFRAN-ODT) disintegrating tablet 4 mg  4 mg Oral Q6H PRN Bing Neighbors, NP       thiamine (VITAMIN B1) injection 100 mg  100 mg Intramuscular Once Bing Neighbors, NP       thiamine (VITAMIN B1) tablet 100 mg  100 mg Oral Daily Bing Neighbors, NP       traZODone (DESYREL) tablet 50 mg  50 mg Oral QHS PRN Onuoha, Chinwendu V, NP   50 mg at 06/02/22 2042   Current Outpatient Medications  Medication Sig Dispense Refill   amLODipine-valsartan (EXFORGE) 10-320 MG tablet Take 1 tablet by mouth daily. 90 tablet 0   famotidine (PEPCID) 40 MG tablet Take 1 tablet (40 mg total) by mouth 2 (two) times daily. 60 tablet 0   fluticasone-salmeterol (ADVAIR DISKUS) 100-50 MCG/ACT AEPB Inhale 1 puff into the lungs 2 (two) times daily. 180 each 1   hydrochlorothiazide (HYDRODIURIL) 25 MG tablet Take 1 tablet (25 mg total) by mouth daily. 60 tablet 0   ibuprofen (ADVIL) 200 MG tablet Take 600 mg by mouth every 6 (six) hours as needed (For body aches).     naltrexone (DEPADE) 50 MG tablet Take 0.5 tablets (25 mg total) by mouth daily. 90 tablet 0    PTA Medications:  Facility Ordered Medications  Medication   cloNIDine (CATAPRES) tablet 0.1 mg   thiamine (VITAMIN B1) injection 100 mg   thiamine (VITAMIN B1) tablet 100 mg   multivitamin with minerals tablet 1 tablet   LORazepam (ATIVAN) tablet 1 mg   loperamide (IMODIUM) capsule 2-4 mg   ondansetron (ZOFRAN-ODT) disintegrating tablet 4 mg   LORazepam (ATIVAN) tablet 1 mg   Followed by   Melene Muller ON 06/04/2022] LORazepam (ATIVAN) tablet 1 mg   Followed by   Melene Muller ON 06/05/2022] LORazepam (ATIVAN) tablet 1 mg   Followed by   Melene Muller ON 06/06/2022] LORazepam (ATIVAN) tablet 1 mg   nicotine (NICODERM CQ - dosed in mg/24 hours) patch 21 mg   [EXPIRED] nicotine (NICODERM CQ - dosed in mg/24 hours) 21 mg/24hr patch   gabapentin (NEURONTIN) capsule 200 mg   amLODipine  (NORVASC) tablet 10 mg   irbesartan (AVAPRO) tablet 300 mg   famotidine (PEPCID) tablet 40 mg   mometasone-formoterol (DULERA) 100-5 MCG/ACT inhaler 2 puff   naltrexone (DEPADE) tablet 25 mg   hydrochlorothiazide (HYDRODIURIL) tablet 25 mg   traZODone (DESYREL) tablet 50 mg   PTA Medications  Medication Sig   fluticasone-salmeterol (ADVAIR DISKUS) 100-50 MCG/ACT AEPB Inhale 1 puff into the lungs 2 (two) times daily.   amLODipine-valsartan (EXFORGE) 10-320 MG tablet Take 1 tablet by  mouth daily.   naltrexone (DEPADE) 50 MG tablet Take 0.5 tablets (25 mg total) by mouth daily.   hydrochlorothiazide (HYDRODIURIL) 25 MG tablet Take 1 tablet (25 mg total) by mouth daily.   famotidine (PEPCID) 40 MG tablet Take 1 tablet (40 mg total) by mouth 2 (two) times daily.       05/31/2022    9:31 AM 05/17/2022    9:29 AM  Depression screen PHQ 2/9  Decreased Interest 0 0  Down, Depressed, Hopeless 0 0  PHQ - 2 Score 0 0  Altered sleeping 0 1  Tired, decreased energy 0 1  Change in appetite 0 0  Feeling bad or failure about yourself  0 0  Trouble concentrating 0 0  Moving slowly or fidgety/restless 0 0  Suicidal thoughts 0 0  PHQ-9 Score 0 2  Difficult doing work/chores Not difficult at all Not difficult at all    [facility] ED from 06/02/2022 in [facility] Most recent reading at 06/02/2022  1:49 PM ED from 06/02/2022 in [facility] Most recent reading at 06/02/2022  8:15 AM  C-SSRS RISK CATEGORY No Risk No Risk      Psychiatric Specialty Exam  Presentation  General Appearance:  Appropriate for Environment  Eye Contact: Good  Speech: Clear and Coherent  Speech Volume: Normal  Handedness: Right   Mood and Affect  Mood: Anxious  Affect: Congruent   Thought Process  Thought Processes: Coherent  Descriptions of Associations:Intact  Orientation:Full (Time, Place and Person)  Thought Content:Logical   Diagnosis of Schizophrenia or Schizoaffective disorder in past: No   Hallucinations: None   Ideas of Reference:None  Suicidal Thoughts:Suicidal Thoughts: No  Homicidal Thoughts:Homicidal Thoughts: No   Sensorium  Memory: Immediate Good; Recent Good; Remote Good  Judgment: Fair  Insight: Fair   Art therapist  Concentration: Good  Attention Span: Good  Recall: Good  Fund of Knowledge: Good  Language: Good   Psychomotor Activity  Psychomotor Activity: Psychomotor Activity: Normal   Assets  Assets: Desire for Improvement   Sleep  Sleep: Sleep: Fair   Nutritional Assessment (For OBS and FBC admissions only) Has the patient had a weight loss or gain of 10 pounds or more in the last 3 months?: No Has the patient had a decrease in food intake/or appetite?: No Does the patient have dental problems?: No Does the patient have eating habits or behaviors that may be indicators of an eating disorder including binging or inducing vomiting?: No Has the patient recently lost weight without trying?: 0 Has the patient been eating poorly because of a decreased appetite?: 0 Malnutrition Screening Tool Score: 0    Physical Exam  Physical Exam Vitals reviewed.  HENT:     Head: Normocephalic.     Right Ear: External ear normal.     Left Ear: External ear normal.  Eyes:     Extraocular Movements: Extraocular movements intact.     Pupils: Pupils are equal, round, and reactive to light.  Cardiovascular:     Rate and Rhythm: Normal rate and regular rhythm.  Pulmonary:     Effort: Pulmonary effort is normal.  Musculoskeletal:        General: Normal range of motion.     Cervical back: Normal range of motion.  Neurological:     General: No focal deficit present.     Mental Status: He is alert and oriented to person, place, and time.     Coordination: Coordination normal.  Gait: Gait normal.      Review of Systems  Psychiatric/Behavioral:  Positive  for substance abuse. Negative for depression, hallucinations and suicidal ideas. The patient is not nervous/anxious and does not have insomnia.      Blood pressure (!) 147/98, pulse 96, temperature 98 F (36.7 C), temperature source Oral, resp. rate 18, SpO2 96 %. There is no height or weight on file to calculate BMI.  Demographic Factors:  Male and Caucasian  Loss Factors: NA  Historical Factors: Impulsivity  Risk Reduction Factors:   Employed, Living with another person, especially a relative, and Positive social support  Continued Clinical Symptoms:  Alcohol/Substance Abuse/Dependencies  Cognitive Features That Contribute To Risk:  Thought constriction (tunnel vision)    Suicide Risk:  Minimal: No identifiable suicidal ideation.  Patients presenting with no risk factors but with morbid ruminations; may be classified as minimal risk based on the severity of the depressive symptoms  Plan Of Care/Follow-up recommendations:  Other:  Tobacco Cessation, discharge with prescription for nicotine patch. AUD-continue Naltrexone 25 mg daily. Advised to continue Gabapentin 200 mg BID. Continue all home medications for management of chronic conditions Provided education of  AUD and resources for outpatient therapy session available at no charge here at [facility]. Return here at needed.   Disposition: Home to Self   Joaquin Courts, FNP-C, PMHNP-BC  Behavioral Health Service Line  [facility] Urgent [phone]  06/03/2022, 8:32 AM

## 2022-06-03 NOTE — ED Notes (Signed)
Pt sleeping at present, no distress noted.  Monitoring for safety. 

## 2022-06-28 ENCOUNTER — Other Ambulatory Visit (HOSPITAL_BASED_OUTPATIENT_CLINIC_OR_DEPARTMENT_OTHER): Payer: Self-pay

## 2022-06-28 ENCOUNTER — Encounter (HOSPITAL_BASED_OUTPATIENT_CLINIC_OR_DEPARTMENT_OTHER): Payer: Self-pay | Admitting: Family Medicine

## 2022-06-28 ENCOUNTER — Ambulatory Visit (INDEPENDENT_AMBULATORY_CARE_PROVIDER_SITE_OTHER): Payer: BC Managed Care – PPO | Admitting: Family Medicine

## 2022-06-28 VITALS — BP 141/84 | HR 109 | Temp 97.8°F | Ht 76.0 in | Wt 228.0 lb

## 2022-06-28 DIAGNOSIS — I10 Essential (primary) hypertension: Secondary | ICD-10-CM

## 2022-06-28 DIAGNOSIS — J449 Chronic obstructive pulmonary disease, unspecified: Secondary | ICD-10-CM

## 2022-06-28 DIAGNOSIS — F1010 Alcohol abuse, uncomplicated: Secondary | ICD-10-CM

## 2022-06-28 DIAGNOSIS — Z114 Encounter for screening for human immunodeficiency virus [HIV]: Secondary | ICD-10-CM

## 2022-06-28 DIAGNOSIS — Z1159 Encounter for screening for other viral diseases: Secondary | ICD-10-CM

## 2022-06-28 DIAGNOSIS — Z0000 Encounter for general adult medical examination without abnormal findings: Secondary | ICD-10-CM | POA: Diagnosis not present

## 2022-06-28 MED ORDER — BREZTRI AEROSPHERE 160-9-4.8 MCG/ACT IN AERO
2.0000 | INHALATION_SPRAY | Freq: Two times a day (BID) | RESPIRATORY_TRACT | 6 refills | Status: AC
  Filled 2022-06-28: qty 10.7, 30d supply, fill #0

## 2022-06-28 MED ORDER — NALTREXONE HCL 50 MG PO TABS
25.0000 mg | ORAL_TABLET | Freq: Every day | ORAL | 0 refills | Status: AC
  Filled 2022-06-28: qty 45, 90d supply, fill #0

## 2022-06-28 NOTE — Progress Notes (Signed)
Established Patient Office Visit  Subjective   Patient ID: Adam Esparza, male    DOB: [birth year]  Age: 52 y.o. MRN: [id number]  Adam Esparza is a 52 yo male patient who presents for management of chronic conditions.   HTN: currently taking Amlodipine-valsartan 10-320mg  daily & HCTZ 25mg  daily  BP last night 127/86  Alcohol use: presented to [facility] on 06/02/2022 for detox from alcohol.  Reports he is taking naltrexone and reports decrease in his cravings. He reports he is only have "a shot of liquor" each night and has not purchased a bottle of alcohol in about a month. This is good progress!!   COPD: reports he receives adequate control of COPD with Advair; however, he ran out of his inhaler and has not picked it up recently.   Review of Systems  Constitutional:  Negative for malaise/fatigue.  Respiratory:  Positive for cough (chronic). Negative for shortness of breath.   Cardiovascular:  Negative for chest pain, palpitations, claudication and leg swelling.  Gastrointestinal:  Negative for abdominal pain, nausea and vomiting.  Musculoskeletal:  Negative for myalgias.  Neurological:  Negative for dizziness, weakness and headaches.  Psychiatric/Behavioral:  Negative for depression and suicidal ideas. The patient is not nervous/anxious.       Objective:     BP (!) 141/84 (BP Location: Left Arm, Patient Position: Sitting, Cuff Size: Large)   Pulse (!) 109   Temp 97.8 F (36.6 C) (Oral)   Ht 6\' 4"  (1.93 m)   Wt 228 lb (103.4 kg)   SpO2 98%   BMI 27.75 kg/m  BP Readings from Last 3 Encounters:  06/28/22 (!) 141/84  06/03/22 (!) 147/98  06/02/22 (!) 144/108    Physical Exam Constitutional:      Appearance: Normal appearance.  Cardiovascular:     Rate and Rhythm: Normal rate and regular rhythm.     Pulses: Normal pulses.     Heart sounds: Normal heart sounds.  Pulmonary:     Effort: Pulmonary effort is normal. Prolonged expiration present. No accessory muscle  usage or respiratory distress.     Breath sounds: Examination of the right-lower field reveals wheezing. Examination of the left-lower field reveals wheezing. Wheezing present.  Neurological:     Mental Status: He is alert.  Psychiatric:        Mood and Affect: Mood normal.        Behavior: Behavior normal.        Thought Content: Thought content normal.        Judgment: Judgment normal.    Assessment & Plan:  1. Primary hypertension Patient here for hypertension follow-up. Reports checking his blood pressure in the afternoon or evenings with readings 120-130s/70-90s. Borderline in office today but improved since previous visits. Denies adverse side effects from medications. Patient well-appearing and in no acute distress. Cardiovascular exam with heart regular rate and rhythm. Normal heart sounds, no murmurs present. No lower extremity edema present. Will continue with current medication regimen-- amlodipine-valsartan 10-320mg  daily and HCTZ 25mg  daily.   2. Alcohol abuse Patient presented to [facility] on 06/02/22 for alcohol detox. He reports he did not receive in-patient treatment (mandatory 72-hour consent for admission required) and was provided with outpatient alcohol addiction treatment and support. He is still considering attending outpatient meetings. He started taking his naltrexone 25mg  daily after seeking assistance at [facility]. Was not originally started when first prescribed due to cost. Provided refill for this medication today. Advised him that we could place  a referral for population health to assist with medication costs; however, he declined at this time.   3. Chronic obstructive pulmonary disease, unspecified COPD type (HCC) He does have a history of COPD and reports he does smoke cigarettes daily. He is not interested in quitting at this time. Patient in no acute distress and is not short of breath during visit. Lungs clear to auscultation bilaterally anteriorly, wheezing present in  bilateral lower lobes posteriorly. Patient reports adequate control of COPD symptoms with Advair. However, he has not taken "for the past few days due to cost." Provided patient with Breztri sample due to acute wheezing. Advised patient to take 2 inhalations twice daily. Will send to pharmacy and see if they can assist with decreasing medication cost of this inhaler.   4. Wellness examination Plan to complete physical within the next 3 months. Routine HCM lab orders placed.  - CBC with Differential/Platelet; Future - Hemoglobin A1c; Future - Lipid panel; Future - TSH Rfx on Abnormal to Free T4; Future - Comprehensive metabolic panel; Future  5. Need for hepatitis C screening test Will complete with routine HCM labs for physical.  - HCV RNA quant rflx ultra or genotyp; Future  6. Screening for HIV (human immunodeficiency virus) Will complete with routine HCM labs for physical.  - HIV Antibody (routine testing w rflx); Future     Return in about 3 months (around 09/28/2022) for Physical with fasting labs.    Alyson Reedy, FNP

## 2022-07-05 ENCOUNTER — Other Ambulatory Visit (HOSPITAL_BASED_OUTPATIENT_CLINIC_OR_DEPARTMENT_OTHER): Payer: Self-pay

## 2022-07-06 ENCOUNTER — Encounter (HOSPITAL_BASED_OUTPATIENT_CLINIC_OR_DEPARTMENT_OTHER): Payer: Self-pay | Admitting: Emergency Medicine

## 2022-07-06 ENCOUNTER — Other Ambulatory Visit: Payer: Self-pay

## 2022-07-06 ENCOUNTER — Emergency Department (HOSPITAL_BASED_OUTPATIENT_CLINIC_OR_DEPARTMENT_OTHER): Payer: BC Managed Care – PPO | Admitting: Radiology

## 2022-07-06 ENCOUNTER — Emergency Department (HOSPITAL_BASED_OUTPATIENT_CLINIC_OR_DEPARTMENT_OTHER)
Admission: EM | Admit: 2022-07-06 | Discharge: 2022-07-06 | Disposition: A | Discharge status: Home / Self Care | Payer: BC Managed Care – PPO | Attending: Emergency Medicine | Admitting: Emergency Medicine

## 2022-07-06 DIAGNOSIS — S91011A Laceration without foreign body, right ankle, initial encounter: Secondary | ICD-10-CM | POA: Insufficient documentation

## 2022-07-06 DIAGNOSIS — I10 Essential (primary) hypertension: Secondary | ICD-10-CM | POA: Diagnosis not present

## 2022-07-06 DIAGNOSIS — W268XXA Contact with other sharp object(s), not elsewhere classified, initial encounter: Secondary | ICD-10-CM | POA: Diagnosis not present

## 2022-07-06 MED ORDER — LIDOCAINE HCL (PF) 1 % IJ SOLN
5.0000 mL | Freq: Once | INTRAMUSCULAR | Status: AC
  Administered 2022-07-06: 5 mL
  Filled 2022-07-06: qty 5

## 2022-07-06 NOTE — ED Provider Notes (Signed)
[place] EMERGENCY DEPARTMENT AT [facility] Provider Note   CSN: [id number] Arrival date & time: 07/06/22  4098     History  Chief Complaint  Patient presents with   Laceration    Adam Esparza is a 52 y.o. male.  Patient is a 52 year old male with a past medical history of hypertension presenting to the emergency department with a laceration to his right ankle.  The patient states that he was using a vase to water plants this morning when he accidentally dropped a vase and it shattered and cut his ankle.  He denies any numbness or weakness.  He states that his tetanus was updated in the last year.  He denies any other injuries.  The history is provided by the patient and the spouse.  Laceration      Home Medications Prior to Admission medications   Medication Sig Start Date End Date Taking? Authorizing Provider  amLODipine-valsartan (EXFORGE) 10-320 MG tablet Take 1 tablet by mouth daily. 05/19/22   Alyson Reedy, FNP  Budeson-Glycopyrrol-Formoterol (BREZTRI AEROSPHERE) 160-9-4.8 MCG/ACT AERO Inhale 2 puffs into the lungs 2 (two) times daily. 06/28/22   Alyson Reedy, FNP  famotidine (PEPCID) 40 MG tablet Take 1 tablet (40 mg total) by mouth 2 (two) times daily. 05/31/22   Alyson Reedy, FNP  hydrochlorothiazide (HYDRODIURIL) 25 MG tablet Take 1 tablet (25 mg total) by mouth daily. 05/31/22   Alyson Reedy, FNP  naltrexone (DEPADE) 50 MG tablet Take 0.5 tablets (25 mg total) by mouth daily. 06/28/22   Alyson Reedy, FNP      Allergies    Amoxicillin, Percocet [oxycodone-acetaminophen], and Sulfa antibiotics    Review of Systems   Review of Systems  Physical Exam Updated Vital Signs BP 132/88 (BP Location: Right Arm)   Pulse (!) 111   Temp 98.9 F (37.2 C) (Oral)   Resp 20   SpO2 96%  Physical Exam Vitals and nursing note reviewed.  Constitutional:      General: He is not in acute distress.    Appearance: Normal appearance.  HENT:     Head:  Normocephalic.     Nose: Nose normal.  Eyes:     Extraocular Movements: Extraocular movements intact.  Cardiovascular:     Rate and Rhythm: Normal rate.     Pulses: Normal pulses.  Pulmonary:     Effort: Pulmonary effort is normal.  Musculoskeletal:        General: Normal range of motion.     Cervical back: Normal range of motion.     Comments: Ankle dorsi/plantarflexion intact, no bony tenderness to palpation  Skin:    General: Skin is warm and dry.     Comments: Approximately 3 cm gaping laceration over right medial malleolus with no active bleeding  Neurological:     General: No focal deficit present.     Mental Status: He is alert and oriented to person, place, and time.  Psychiatric:        Mood and Affect: Mood normal.        Behavior: Behavior normal.     ED Results / Procedures / Treatments   Labs (all labs ordered are listed, but only abnormal results are displayed) Labs Reviewed - No data to display  EKG None  Radiology DG Ankle Complete Right  Result Date: 07/06/2022 CLINICAL DATA:  lac, eval for foreign body EXAM: RIGHT ANKLE - COMPLETE 3 VIEW COMPARISON:  None Available. FINDINGS: There is no evidence of fracture, dislocation, or joint effusion.  There are degenerative changes of the tibiotalar joint. Calcaneal enthesophyte. No radiopaque foreign body. IMPRESSION: No radiopaque foreign body. Electronically Signed   By: Lorenza Cambridge M.D.   On: 07/06/2022 07:58    Procedures .Marland KitchenLaceration Repair  Date/Time: 07/06/2022 8:19 AM  Performed by: Rexford Maus, DO Authorized by: Rexford Maus, DO   Consent:    Consent obtained:  Verbal   Consent given by:  Patient   Risks, benefits, and alternatives were discussed: yes     Risks discussed:  Infection, pain, poor cosmetic result, retained foreign body and poor wound healing   Alternatives discussed:  No treatment and delayed treatment Universal protocol:    Imaging studies available: yes      Patient identity confirmed:  Verbally with patient Anesthesia:    Anesthesia method:  Local infiltration   Local anesthetic:  Lidocaine 1% w/o epi Laceration details:    Location:  Leg   Leg location:  R lower leg   Length (cm):  3   Depth (mm):  2 Pre-procedure details:    Preparation:  Imaging obtained to evaluate for foreign bodies Exploration:    Limited defect created (wound extended): yes     Hemostasis achieved with:  Direct pressure   Imaging obtained: x-ray     Imaging outcome: foreign body not noted     Wound exploration: wound explored through full range of motion and entire depth of wound visualized     Wound extent: areolar tissue not violated, fascia not violated, no foreign body, no signs of injury, no nerve damage, no tendon damage, no underlying fracture and no vascular damage     Contaminated: no   Treatment:    Area cleansed with:  Shur-Clens   Amount of cleaning:  Standard   Irrigation solution:  Sterile saline   Visualized foreign bodies/material removed: no     Debridement:  None   Undermining:  None   Scar revision: no   Skin repair:    Repair method:  Sutures   Suture size:  4-0   Suture material:  Prolene   Suture technique:  Simple interrupted   Number of sutures:  6 Approximation:    Approximation:  Close Repair type:    Repair type:  Simple Post-procedure details:    Dressing:  Adhesive bandage   Procedure completion:  Tolerated well, no immediate complications     Medications Ordered in ED Medications  lidocaine (PF) (XYLOCAINE) 1 % injection 5 mL (has no administration in time range)    ED Course/ Medical Decision Making/ A&P Clinical Course as of 07/06/22 0821  Wed Jul 06, 2022  0801 XR negative for foreign body  [VK]    Clinical Course User Index [VK] Rexford Maus, DO                             Medical Decision Making This patient presents to the ED with chief complaint(s) of ankle laceration with pertinent past  medical history of HTN which further complicates the presenting complaint. The complaint involves an extensive differential diagnosis and also carries with it a high risk of complications and morbidity.    The differential diagnosis includes laceration, he is neurovascularly intact making neurovascular injury unlikely, considering foreign body, range of motion is intact making ligamentous or tendinous injury unlikely  Additional history obtained: Additional history obtained from spouse Records reviewed N/A  ED Course and Reassessment: On patient's arrival to the  emergency department he is hemodynamically stable in no acute distress.  His wound was irrigated by staff does have a gaping wound to his right medial malleolus.  Due to a cut being from broken glass, he will have an x-ray performed to evaluate for foreign body.  Tetanus does not need to be updated today.  He declined any additional pain medication.  He will require laceration repair.  Independent labs interpretation:  N/A  Independent visualization of imaging: - I independently visualized the following imaging with scope of interpretation limited to determining acute life threatening conditions related to emergency care: R ankle XR, which revealed no foreign body  Consultation: - Consulted or discussed management/test interpretation w/ external professional: N/A  Consideration for admission or further workup: Patient has no emergent conditions requiring admission or further work-up at this time and is stable for discharge home with primary care follow-up  Social Determinants of health: N/A    Amount and/or Complexity of Data Reviewed Radiology: ordered.  Risk Prescription drug management.          Final Clinical Impression(s) / ED Diagnoses Final diagnoses:  Laceration of right ankle, initial encounter    Rx / DC Orders ED Discharge Orders     None         Rexford Maus, DO 07/06/22 [id number]

## 2022-07-06 NOTE — ED Notes (Signed)
Discharge instructions, suture removal, and follow up care reviewed and explained, pt verbalized understanding and had no further questions on d/c. Pt caox4, ambulatory NAD on d/c.

## 2022-07-06 NOTE — ED Triage Notes (Signed)
Pt arrived POV, caox4, ambulatory with laceration to the R ankle, stating around 5am he was watering flowers when a vase broke and cut his ankle. Denies pain at present. Denies any other injury. PMS intact. Reports last tetanus was one year ago.

## 2022-07-06 NOTE — Discharge Instructions (Addendum)
You were seen in the emergency department for your ankle laceration.  You had no foreign bodies on your x-ray.  You did require 6 stitches.  These will need to be removed in about 7 to 10 days.  You can shower normally just do not soak your leg underwater while the stitches are in place.  You can follow-up with your primary doctor, urgent care or return to the ER to have your sutures removed.  You should return to the emergency department sooner if you notice pus draining from your wound, spreading redness from your wound, you are being fevers or if you have any other new or concerning symptoms.

## 2022-07-14 ENCOUNTER — Ambulatory Visit (INDEPENDENT_AMBULATORY_CARE_PROVIDER_SITE_OTHER): Payer: BC Managed Care – PPO | Admitting: Family Medicine

## 2022-07-14 ENCOUNTER — Encounter (HOSPITAL_BASED_OUTPATIENT_CLINIC_OR_DEPARTMENT_OTHER): Payer: Self-pay | Admitting: Family Medicine

## 2022-07-14 VITALS — BP 144/94 | HR 109 | Ht 76.0 in | Wt 231.0 lb

## 2022-07-14 DIAGNOSIS — R109 Unspecified abdominal pain: Secondary | ICD-10-CM | POA: Insufficient documentation

## 2022-07-14 DIAGNOSIS — R300 Dysuria: Secondary | ICD-10-CM | POA: Diagnosis not present

## 2022-07-14 LAB — POCT URINALYSIS DIPSTICK
Bilirubin, UA: NEGATIVE
Blood, UA: NEGATIVE
Glucose, UA: NEGATIVE
Ketones, UA: NEGATIVE
Leukocytes, UA: NEGATIVE
Nitrite, UA: NEGATIVE
Protein, UA: NEGATIVE
Spec Grav, UA: 1.01 (ref 1.010–1.025)
Urobilinogen, UA: 0.2 E.U./dL
pH, UA: 5.5 (ref 5.0–8.0)

## 2022-07-14 MED ORDER — AMLODIPINE BESYLATE-VALSARTAN 10-160 MG PO TABS: 1.0000 | ORAL_TABLET | Freq: Every day | ORAL | 0 refills | Status: DC

## 2022-07-14 MED ORDER — HYDROCHLOROTHIAZIDE 12.5 MG PO TABS: 25.0000 mg | ORAL_TABLET | Freq: Two times a day (BID) | ORAL | 0 refills | Status: DC

## 2022-07-14 MED ORDER — CYCLOBENZAPRINE HCL 5 MG PO TABS
5.0000 mg | ORAL_TABLET | Freq: Two times a day (BID) | ORAL | 0 refills | Status: DC
Start: 2022-07-14 — End: 2022-08-04

## 2022-07-14 NOTE — Progress Notes (Signed)
   Established Patient Office Visit  Subjective   Patient ID: Adam Esparza, male    DOB: 1970-09-04  Age: 52 y.o. MRN: [id number]  Chief Complaint  Patient presents with   Back Pain    Lower left side, ongoing for about a week   Dysuria    Odor to urine, cloudy appearance   Adam Esparza is a 52 yo male patient who presents today for dysuria and left lower back pain.  It has been aching there for about a week- has ignored it. Yesterday, it went away and today it is severe. Constant dull pain. He reports his urine is foul-smelling and cloudy in appearance. Reports he is adequately hydrated.   Denies fever/chills, frequency, urgency, hematuria, or discharge.    Review of Systems  Constitutional:  Negative for chills, fever and weight loss.  Eyes:  Negative for blurred vision and double vision.  Respiratory:  Negative for cough and shortness of breath.   Cardiovascular:  Negative for chest pain and palpitations.  Gastrointestinal:  Negative for abdominal pain, nausea and vomiting.  Genitourinary:  Positive for dysuria and flank pain. Negative for frequency, hematuria and urgency.  Musculoskeletal:  Negative for myalgias.  Neurological:  Negative for dizziness, weakness and headaches.  Psychiatric/Behavioral:  Negative for suicidal ideas.       Objective:     BP (!) 144/94   Pulse (!) 109   Ht 6\' 4"  (1.93 m)   Wt 231 lb (104.8 kg)   SpO2 98%   BMI 28.12 kg/m  BP Readings from Last 3 Encounters:  07/14/22 (!) 144/94  07/06/22 132/88  06/28/22 (!) 141/84     Physical Exam Constitutional:      Appearance: Normal appearance.  Cardiovascular:     Rate and Rhythm: Normal rate and regular rhythm.     Pulses: Normal pulses.     Heart sounds: Normal heart sounds.  Pulmonary:     Effort: Pulmonary effort is normal.     Breath sounds: Normal breath sounds.  Abdominal:     General: Bowel sounds are normal.     Palpations: Abdomen is soft.     Tenderness: There is no  right CVA tenderness or left CVA tenderness.  Neurological:     Mental Status: He is alert.  Psychiatric:        Mood and Affect: Mood normal.        Behavior: Behavior normal.        Thought Content: Thought content normal.        Judgment: Judgment normal.    Assessment & Plan:  1. Acute left flank pain Patient is well-appearing and in no acute distress. Blood pressure and heart rate are slightly elevated. The abdomen is soft without tenderness, guarding, mass, rebound or organomegaly. No CVA tenderness. Urine dipstick shows negative for all components- no evidence of urinary tract infection or pyelonephritis. Tenderness to palpation in lower left back. Feel this is in musculoskeletal etiology. Will send Flexeril as needed for muscle spasms. Advised patient to return to office if symptoms worsen or persist.  - cyclobenzaprine (FLEXERIL) 5 MG tablet; Take 1 tablet (5 mg total) by mouth 2 (two) times daily.  Dispense: 60 tablet; Refill: 0  2. Dysuria See #1 - POCT Urinalysis Dipstick - Urine Culture   Return if symptoms worsen or fail to improve.    Alyson Reedy, FNP

## 2022-07-16 LAB — URINE CULTURE: Organism ID, Bacteria: NO GROWTH

## 2022-07-19 ENCOUNTER — Other Ambulatory Visit (HOSPITAL_BASED_OUTPATIENT_CLINIC_OR_DEPARTMENT_OTHER): Payer: Self-pay

## 2022-07-19 ENCOUNTER — Other Ambulatory Visit (HOSPITAL_BASED_OUTPATIENT_CLINIC_OR_DEPARTMENT_OTHER): Payer: Self-pay | Admitting: Family Medicine

## 2022-07-20 ENCOUNTER — Ambulatory Visit: Payer: BC Managed Care – PPO | Admitting: Urology

## 2022-07-20 DIAGNOSIS — R7989 Other specified abnormal findings of blood chemistry: Secondary | ICD-10-CM

## 2022-07-21 ENCOUNTER — Telehealth (HOSPITAL_BASED_OUTPATIENT_CLINIC_OR_DEPARTMENT_OTHER): Payer: Self-pay | Admitting: Family Medicine

## 2022-07-21 DIAGNOSIS — I10 Essential (primary) hypertension: Secondary | ICD-10-CM

## 2022-07-21 NOTE — Telephone Encounter (Signed)
Provider was referring to community care coordination referral, attempted to place referral and got kickback. Reached out to our Child psychotherapist, awaiting response.

## 2022-07-21 NOTE — Telephone Encounter (Signed)
Patient recently lost his job, says he has no funds to get his blood pressure medications. I let him know about out [facility] pharmacy, he stated PCP let him know about a possible program that he may use I am not sure of it. But will discuss with PCP and reach back out to patient.

## 2022-07-21 NOTE — Telephone Encounter (Signed)
Patient called having issue with blood pressure medication would like a callback.

## 2022-07-21 NOTE — Telephone Encounter (Signed)
See below

## 2022-07-22 ENCOUNTER — Other Ambulatory Visit (HOSPITAL_BASED_OUTPATIENT_CLINIC_OR_DEPARTMENT_OTHER): Payer: Self-pay

## 2022-07-22 ENCOUNTER — Other Ambulatory Visit (HOSPITAL_BASED_OUTPATIENT_CLINIC_OR_DEPARTMENT_OTHER): Payer: Self-pay | Admitting: Family Medicine

## 2022-07-22 ENCOUNTER — Telehealth (HOSPITAL_BASED_OUTPATIENT_CLINIC_OR_DEPARTMENT_OTHER): Payer: Self-pay

## 2022-07-22 ENCOUNTER — Telehealth (HOSPITAL_BASED_OUTPATIENT_CLINIC_OR_DEPARTMENT_OTHER): Payer: Self-pay | Admitting: Family Medicine

## 2022-07-22 MED ORDER — HYDROCHLOROTHIAZIDE 25 MG PO TABS
25.0000 mg | ORAL_TABLET | Freq: Every day | ORAL | 2 refills | Status: DC
  Filled 2022-07-22: qty 30, 30d supply, fill #0

## 2022-07-22 MED ORDER — AMLODIPINE BESYLATE 10 MG PO TABS
10.0000 mg | ORAL_TABLET | Freq: Every day | ORAL | 2 refills | Status: DC
  Filled 2022-07-22: qty 30, 30d supply, fill #0

## 2022-07-22 MED ORDER — VALSARTAN 320 MG PO TABS
320.0000 mg | ORAL_TABLET | Freq: Every day | ORAL | 2 refills | Status: DC
  Filled 2022-07-22: qty 30, 30d supply, fill #0

## 2022-07-22 NOTE — Telephone Encounter (Signed)
Called Adam Esparza regarding status of his blood pressure medications. Patient reports that he lost his job and is unable to afford his medication. He is agreeable to have assistance from [facility] social worker- she has been notified and will attempt to fit him into her schedule today. He reports that he is completely out of his medication. Made adjustments to blood pressure medication regimen- sent in individual prescriptions for amlodipine 10mg  daily, valsartan 320mg  daily, and HCTZ 25mg  daily to [facility]. If patient is unable to afford all medications at this time, advised patient to take amlodipine and valsartan.

## 2022-07-22 NOTE — Telephone Encounter (Signed)
Attempted to call patient, unable to reach or leave a voicemail. Will try again later

## 2022-07-22 NOTE — Telephone Encounter (Signed)
Patient returned call, did not get transferred to me. Attempted to call back, unable to reach

## 2022-07-23 ENCOUNTER — Other Ambulatory Visit (HOSPITAL_BASED_OUTPATIENT_CLINIC_OR_DEPARTMENT_OTHER): Payer: Self-pay

## 2022-07-25 ENCOUNTER — Ambulatory Visit: Payer: Self-pay | Admitting: Licensed Clinical Social Worker

## 2022-07-25 ENCOUNTER — Telehealth (HOSPITAL_BASED_OUTPATIENT_CLINIC_OR_DEPARTMENT_OTHER): Payer: Self-pay | Admitting: Family Medicine

## 2022-07-25 ENCOUNTER — Telehealth (HOSPITAL_BASED_OUTPATIENT_CLINIC_OR_DEPARTMENT_OTHER): Payer: Self-pay

## 2022-07-25 ENCOUNTER — Telehealth: Payer: Self-pay | Admitting: Licensed Clinical Social Worker

## 2022-07-25 DIAGNOSIS — I10 Essential (primary) hypertension: Secondary | ICD-10-CM

## 2022-07-25 NOTE — Patient Outreach (Signed)
  Care Coordination   07/25/2022 Name: Adam Esparza MRN: [id number] DOB: 1970-06-29   Care Coordination Outreach Attempts:  An unsuccessful telephone outreach was attempted today to offer the patient information about available care coordination services.  Follow Up Plan:  Additional outreach attempts will be made to offer the patient care coordination information and services.   Encounter Outcome:  No Answer   Care Coordination Interventions:  No, not indicated    Sammuel Hines, LCSW Social Work Care Coordination  [facility] [employer] [phone]

## 2022-07-25 NOTE — Patient Outreach (Signed)
  Care Coordination  Initial Visit Note   07/25/2022 Name: Adam Esparza MRN: [id number] DOB: 13-Jan-1971  Adam Esparza is a 52 y.o. year old male who sees Alyson Reedy, FNP for primary care. I spoke with  Adam Esparza by phone today.  What matters to the patients health and wellness today?  Getting his medication and community support.  Patient lost his job and needs support during this transition    Goals Addressed             This Visit's Progress    COMPLETED: Care Coordination Activies       Activities and task to complete in order to accomplish goals.   Call or go to Department of Social Services to apply for Medicaid and food stamps if you decide Follow up with housing resources discussed (for assistance with rent) I have placed a referral with the pharmacy team they will contact you.  To see if they are able to offer you any assistance with affordability.          SDOH assessments and interventions completed:  Yes  SDOH Interventions Today    Flowsheet Row Most Recent Value  SDOH Interventions   Food Insecurity Interventions Intervention Not Indicated  Housing Interventions Other (Comment)  [provided community resources]  Transportation Interventions Intervention Not Indicated  Utilities Interventions Other (Comment)  [community resources provided]  Financial Strain Interventions Other (Comment)       Care Coordination Interventions:  Yes, provided  Interventions Today    Flowsheet Row Most Recent Value  Chronic Disease   Chronic disease during today's visit Hypertension (HTN), Chronic Obstructive Pulmonary Disease (COPD)  General Interventions   General Interventions Discussed/Reviewed General Interventions Discussed, Programmer, applications, Communication with  Communication with PCP/Specialists  [CMA at provider office]  Education Interventions   Education Provided Provided Education  Pharmacy Interventions   Pharmacy Dicussed/Reviewed Pharmacy  Topics Discussed, Referral to Pharmacist  Referral to Pharmacist Cannot afford medications  [amlodipine,  Valsartan , Hydrochlorothiazide]       Follow up plan:  No further intervention required by social work. Patient is not eligible for long term management services provided by the Care Coordination team Referral made to [facility] pharmacy team   Encounter Outcome:  Pt. Visit Completed   Adam Hines, LCSW Social Work Care Coordination  [facility] [employer] [employer] [phone]

## 2022-07-25 NOTE — Patient Instructions (Signed)
Social Work Visit Information  Thank you for taking time to visit with me today. Please don't hesitate to contact me if I can be of assistance to you.   Following are the goals we discussed today:   Goals Addressed             This Visit's Progress    COMPLETED: Care Coordination Activies       Activities and task to complete in order to accomplish goals.   Call or go to Department of Social Services to apply for Medicaid and food stamps if you decide Follow up with housing resources discussed (for assistance with rent) I have placed a referral with the pharmacy team they will contact you.  To see if they are able to offer you any assistance with affordability.          No follow up scheduled with social work at this time. Patient will call office if needed.  Please call the care guide team at [phone] if you need to cancel or reschedule your appointment.   The patient verbalized understanding of instructions, educational materials, and care plan provided today and DECLINED offer to receive copy of patient instructions, educational materials, and care plan.   Sammuel Hines, LCSW Social Work Care Coordination  [facility] [employer] [employer] [phone]

## 2022-07-25 NOTE — Telephone Encounter (Signed)
Attempted to call patient, phone goes to voicemail. Voicemail is full, unable to leave message

## 2022-07-25 NOTE — Telephone Encounter (Signed)
Attempted to return patients call 3x, phone goes straight to voicemail. Social worker Gavin Pound is also trying to reach him in regards to medication assistance due to patient recently losing his job. Will continue trying to reach out

## 2022-07-25 NOTE — Telephone Encounter (Signed)
Pt called on 07-22-22 at 11:03, he is upset that he has called 6 times and no one in answering the phone   Please call the pt back

## 2022-08-02 ENCOUNTER — Telehealth: Payer: Self-pay

## 2022-08-02 NOTE — Progress Notes (Signed)
   Care Guide Note  08/02/2022 Name: [name] MRN: [id number] DOB: 04-25-1970  Referred by: Alyson Reedy, FNP Reason for referral : Care Coordination (Outreach to schedule with Pharm d )   Adam Esparza is a 52 y.o. year old male who is a primary care patient of Alyson Reedy, FNP. BLYTHE HARTSHORN was referred to the pharmacist for assistance related to HTN.    Successful contact was made with the patient to discuss pharmacy services including being ready for the pharmacist to call at least 5 minutes before the scheduled appointment time, to have medication bottles and any blood sugar or blood pressure readings ready for review. The patient agreed to meet with the pharmacist via with the pharmacist via telephone visit on (date/time).  08/04/2022   Penne Lash, RMA Care Guide [facility]  [place], [place] [address] Direct Dial: [phone] [email or web address]

## 2022-08-04 ENCOUNTER — Other Ambulatory Visit: Payer: BC Managed Care – PPO

## 2022-08-04 NOTE — Progress Notes (Signed)
   08/04/2022 Name: Adam Esparza MRN: [id number] DOB: 04/14/1970  Adam Esparza is a 52 y.o. year old male who presented for a telephone visit.   They were referred to the pharmacist by their PCP for assistance in managing medication access.   Patient is participating in a Managed Medicaid Plan:  No  Subjective/Objective:  Care Team: Primary Care Provider: Alyson Reedy, FNP ; Next Scheduled Visit: 09/27/22  Medication Access/Adherence -Patient recently lost is job, and he nor his spouse have income at this time -No medication coverage, so he is unable to afford his medications -Reviewed active medication list -Patient endorses having approximately 2 weeks of his maintenance medications on hand at this time -Evaluated patient eligibility for Medicaid and/or [facility] [facility] free pharmacy program  Assessment/Plan:  Medication Access/Adherence -Patient would qualify for [facility] based on income -Formulary does not include Breztri or valsartan; but they have generic Advair and losartan and irbesartan -Patient states he was on Advair in the past, and it worked well for him.  Therapy was changed due to affordability.  He is also open to ARB change based on formulary. -Initiated application for [facility] with patient on the phone, but will have to wait for patient to provide proof of address, zero income letter, and most recent tax documents to complete -Once completed and determination is known, will discuss changing inhaler and ARB therapies with PCP and send prescriptions to [facility] to fill and mail to patient  Lenna Gilford, PharmD, DPLA

## 2022-09-06 ENCOUNTER — Telehealth: Payer: Self-pay

## 2022-09-06 NOTE — Progress Notes (Signed)
   09/06/2022  Patient ID: Adam Esparza, male   DOB: 06/19/1970, 52 y.o.   MRN: [id number]  Outreach attempt to follow-up with patient on medication access/adherence.  We spoke last month, and I was assisting with application process for [facility].  Patient was to provide me with information needed to complete the application, and I have not received that yet.  Called patient's listed number and wife's, but neither numbers are currently working.  Patient is scheduled to see PCP, Alyson Reedy, 8/6; and I will see if the office can get an updated phone number I will be able to reach [name] at then.  Lenna Gilford, PharmD, DPLA

## 2022-09-07 ENCOUNTER — Telehealth (HOSPITAL_BASED_OUTPATIENT_CLINIC_OR_DEPARTMENT_OTHER): Payer: Self-pay | Admitting: Family Medicine

## 2022-09-07 NOTE — Telephone Encounter (Signed)
Phone call made to patient. Unable to contact

## 2022-09-09 ENCOUNTER — Encounter (HOSPITAL_BASED_OUTPATIENT_CLINIC_OR_DEPARTMENT_OTHER): Payer: Self-pay

## 2022-09-27 ENCOUNTER — Encounter (HOSPITAL_BASED_OUTPATIENT_CLINIC_OR_DEPARTMENT_OTHER): Payer: Self-pay | Admitting: Family Medicine

## 2022-10-04 ENCOUNTER — Other Ambulatory Visit (HOSPITAL_BASED_OUTPATIENT_CLINIC_OR_DEPARTMENT_OTHER): Payer: Self-pay | Admitting: Family Medicine

## 2022-10-04 ENCOUNTER — Telehealth (HOSPITAL_BASED_OUTPATIENT_CLINIC_OR_DEPARTMENT_OTHER): Payer: Self-pay | Admitting: Family Medicine

## 2022-10-04 NOTE — Telephone Encounter (Signed)
Reached out to non-emergency line with [facility] Department for welfare check on patient. Provided concerns regarding issues with recent job loss, financial strain, hypertension, and alcohol abuse. Last office visit was on 07/14/2022. Patient was contacted on 07/21/2022, regarding concerns about his blood pressure and being able to afford medications due to recent job loss. Multiple calls have been attempted to reach patient, along with social work, but unable to contact patient or his wife. Most recent appt on 09/27/2022, patient was a no show. [employer] will reach out to me regarding their findings.

## 2022-11-01 ENCOUNTER — Other Ambulatory Visit (HOSPITAL_BASED_OUTPATIENT_CLINIC_OR_DEPARTMENT_OTHER): Payer: Self-pay | Admitting: Family Medicine

## 2022-11-25 ENCOUNTER — Other Ambulatory Visit: Payer: Self-pay | Admitting: Family Medicine

## 2022-11-25 DIAGNOSIS — Z1212 Encounter for screening for malignant neoplasm of rectum: Secondary | ICD-10-CM

## 2022-11-25 DIAGNOSIS — Z1211 Encounter for screening for malignant neoplasm of colon: Secondary | ICD-10-CM

## 2022-12-08 ENCOUNTER — Other Ambulatory Visit (HOSPITAL_BASED_OUTPATIENT_CLINIC_OR_DEPARTMENT_OTHER): Payer: Self-pay

## 2022-12-09 ENCOUNTER — Other Ambulatory Visit (HOSPITAL_BASED_OUTPATIENT_CLINIC_OR_DEPARTMENT_OTHER): Payer: Self-pay

## 2023-01-12 ENCOUNTER — Encounter (HOSPITAL_BASED_OUTPATIENT_CLINIC_OR_DEPARTMENT_OTHER): Payer: Self-pay | Admitting: Family Medicine

## 2023-01-13 ENCOUNTER — Encounter (HOSPITAL_BASED_OUTPATIENT_CLINIC_OR_DEPARTMENT_OTHER): Payer: Self-pay | Admitting: Family Medicine

## 2023-03-13 ENCOUNTER — Other Ambulatory Visit: Payer: Self-pay | Admitting: Family Medicine

## 2023-03-13 ENCOUNTER — Ambulatory Visit: Payer: Self-pay | Admitting: Family Medicine

## 2023-03-13 NOTE — Telephone Encounter (Addendum)
  Additional Notes: Triage Nurse Placed call to patient, no answer, unable to leave voicemail, 2nd attempt. Callback queued.   Additional Notes: Triage Nurse placed 3rd call attempt to the patient, no answer, unable to leave voicemail. Routing to PCP Office.

## 2023-03-13 NOTE — Telephone Encounter (Signed)
Copied from CRM [id number]. Topic: Clinical - Medication Refill >> Mar 13, 2023 12:54 PM Desma Mcgregor wrote: Most Recent Primary Care Visit:  Provider: Alyson Reedy  Department[facility]  Visit Type: OFFICE VISIT  Date: 07/14/2022  Medication: valsartan (DIOVAN) 320 MG tablet amLODipine (NORVASC) 10 MG tablet   Has the patient contacted their pharmacy? Yes (Agent: If no, request that the patient contact the pharmacy for the refill. If patient does not wish to contact the pharmacy document the reason why and proceed with request.) (Agent: If yes, when and what did the pharmacy advise?) Advised patient to call doctor's office for refills.  Is this the correct pharmacy for this prescription? Yes If no, delete pharmacy and type the correct one.  This is the patient's preferred pharmacy:  [facility] [address] [place] - [address] [place] [address] Phone: [phone] Fax: [phone]   Has the prescription been filled recently? No  Is the patient out of the medication? Yes  Has the patient been seen for an appointment in the last year OR does the patient have an upcoming appointment? Yes  Can we respond through MyChart? No  Agent: Please be advised that Rx refills may take up to 3 business days. We ask that you follow-up with your pharmacy.

## 2023-03-16 ENCOUNTER — Telehealth: Payer: Self-pay | Admitting: Family Medicine

## 2023-03-16 DIAGNOSIS — I10 Essential (primary) hypertension: Secondary | ICD-10-CM

## 2023-03-16 NOTE — Telephone Encounter (Signed)
Attempted to call patient with no answer LVM  Copied from CRM [id number]. Topic: Clinical - Prescription Issue >> Mar 16, 2023  4:42 PM Clayton Bibles wrote: Reason for CRM: [phone] number called and person who answered states that Adam Esparza does not belong to that phone #. Patient needs to be informed to make an appointment with his PCP to have BP meds refilled (refill was denied today). He is calling back and the number above is incorrect - [phone] - Please call him about the refill because he is out

## 2023-03-16 NOTE — Telephone Encounter (Signed)
Patient reports that he has an upcoming appointment with Alyson Reedy, NP, but is out of his blood pressure medications, Amlodipine 10 mg and Valsartan 320 mg, and would like to get something filled until he has his appointment. He would like a call back at [phone]  with an update.

## 2023-03-16 NOTE — Telephone Encounter (Signed)
Copied from CRM [id number]. Topic: Clinical - Prescription Issue >> Mar 16, 2023  4:42 PM Clayton Bibles wrote: Reason for CRM: [id number] number called and person who answered states that Adam Esparza does not belong to that phone #. Patient needs to be informed to make an appointment with his PCP to have BP meds refilled (refill was denied today). He is calling back and the number above is incorrect - [phone] - Please call him about the refill because he is out

## 2023-03-17 MED ORDER — AMLODIPINE BESYLATE 10 MG PO TABS
10.0000 mg | ORAL_TABLET | Freq: Every day | ORAL | 0 refills | Status: DC
Start: 2023-03-17 — End: 2023-05-31

## 2023-03-17 MED ORDER — VALSARTAN 320 MG PO TABS
320.0000 mg | ORAL_TABLET | Freq: Every day | ORAL | 0 refills | Status: DC
Start: 2023-03-17 — End: 2023-05-31

## 2023-03-21 ENCOUNTER — Other Ambulatory Visit: Payer: Self-pay | Admitting: Family Medicine

## 2023-03-21 DIAGNOSIS — I10 Essential (primary) hypertension: Secondary | ICD-10-CM

## 2023-03-31 ENCOUNTER — Ambulatory Visit: Payer: Self-pay | Admitting: Family Medicine

## 2023-05-22 ENCOUNTER — Other Ambulatory Visit: Payer: Self-pay | Admitting: Family Medicine

## 2023-05-22 DIAGNOSIS — I10 Essential (primary) hypertension: Secondary | ICD-10-CM

## 2023-05-22 NOTE — Telephone Encounter (Signed)
 Copied from CRM [id number]. Topic: Clinical - Medication Refill >> May 22, 2023 11:43 AM Franchot Heidelberg wrote: Most Recent Primary Care Visit:  Provider: Alyson Reedy  Department: [facility]  Visit Type: OFFICE VISIT  Date: 07/14/2022  Medication: amLODipine (NORVASC) 10 MG tablet valsartan (DIOVAN) 320 MG tablet  Has the patient contacted their pharmacy? Yes (Agent: If no, request that the patient contact the pharmacy for the refill. If patient does not wish to contact the pharmacy document the reason why and proceed with request.) (Agent: If yes, when and what did the pharmacy advise?)  Is this the correct pharmacy for this prescription? Yes If no, delete pharmacy and type the correct one.  This is the patient's preferred pharmacy:   [id number] [place] [place] [facility] [address] [place] [address] Phone: [phone] Fax: [phone]    Has the prescription been filled recently? Yes  Is the patient out of the medication? Yes has been out for a week   Has the patient been seen for an appointment in the last year OR does the patient have an upcoming appointment? Yes  Can we respond through MyChart? Yes  Agent: Please be advised that Rx refills may take up to 3 business days. We ask that you follow-up with your pharmacy.

## 2023-05-30 ENCOUNTER — Encounter: Payer: Self-pay | Admitting: *Deleted

## 2023-05-30 ENCOUNTER — Ambulatory Visit: Payer: Self-pay

## 2023-05-30 NOTE — Telephone Encounter (Signed)
 Copied from CRM [id number]. Topic: General - Other >> May 30, 2023  4:04 PM Dondra Prader E wrote: Reason for CRM: Pt called and is requesting to speak to Alyson Reedy  Best contact: [phone]

## 2023-05-30 NOTE — Telephone Encounter (Signed)
 Pt reports he has not had his amlodipine or valsartan in 2.5 wks since moving to [place], [place]. Pt endorses 3/10 headache with lightheadedness. Pt denies CP, SOB, blurry vision, or an unsteady gait. Pt reports he cannot see a new PCP in [place] until next month and needs BP meds in the meantime. Pt has not checked his BP in a month and does not know what it is.  RN called the CAL at [facility] where pt's previous PCP Alyson Reedy) is now located. CAL told this RN they would not be able to refill the medication. RN had put the pt on hold. RN returned to the phone call with the pt but his wife was on the line instead. RN advised pt's wife the pt needs to go to UC or the ED within 24 hours for evaluation of moderate dizziness and headache in the setting of likely HTN after having run out of BP meds. Wife states she will advise the pt he needs to be seen in the ED or at [facility]. RN advised pt's wife if the pt develops CP, SOB, blurry vision, sudden worsening of his headache, or N/V he needs to call 911 and go to the ED.  Wife verbalized understanding.

## 2023-05-30 NOTE — Telephone Encounter (Signed)
 This encounter was created in error - please disregard.

## 2023-05-31 ENCOUNTER — Other Ambulatory Visit: Payer: Self-pay | Admitting: Family Medicine

## 2023-05-31 DIAGNOSIS — I10 Essential (primary) hypertension: Secondary | ICD-10-CM

## 2023-05-31 MED ORDER — HYDROCHLOROTHIAZIDE 25 MG PO TABS: 25.0000 mg | ORAL_TABLET | Freq: Every day | ORAL | 0 refills | Status: AC

## 2023-05-31 MED ORDER — AMLODIPINE BESYLATE 10 MG PO TABS: 10.0000 mg | ORAL_TABLET | Freq: Every day | ORAL | 0 refills | Status: AC

## 2023-05-31 MED ORDER — VALSARTAN 320 MG PO TABS: 320.0000 mg | ORAL_TABLET | Freq: Every day | ORAL | 0 refills | Status: AC

## 2023-05-31 NOTE — Progress Notes (Signed)
 Received message about patient calling office. Called patient, confirming identification with two forms of verification. Patient reports he ran out of his blood pressure medication and would like a 30-day supply until he is able to establish with a new primary care provider after relocating to [place], [place]. [facility].

## 2023-06-30 ENCOUNTER — Other Ambulatory Visit: Payer: Self-pay | Admitting: Family Medicine

## 2023-06-30 DIAGNOSIS — I10 Essential (primary) hypertension: Secondary | ICD-10-CM

## 2023-07-08 ENCOUNTER — Other Ambulatory Visit: Payer: Self-pay | Admitting: Family Medicine

## 2023-07-08 DIAGNOSIS — I10 Essential (primary) hypertension: Secondary | ICD-10-CM
